# Patient Record
Sex: Male | Born: 1975 | Race: White | Hispanic: No | Marital: Married | Smoking: Never smoker
Health system: Southern US, Community
[De-identification: ages and names within clinical notes are randomized; demographics above are authoritative.]

## PROBLEM LIST (undated history)

## (undated) DIAGNOSIS — I10 Essential (primary) hypertension: Secondary | ICD-10-CM

## (undated) DIAGNOSIS — I2510 Atherosclerotic heart disease of native coronary artery without angina pectoris: Secondary | ICD-10-CM

## (undated) DIAGNOSIS — E785 Hyperlipidemia, unspecified: Secondary | ICD-10-CM

## (undated) HISTORY — DX: Atherosclerotic heart disease of native coronary artery without angina pectoris: I25.10

## (undated) HISTORY — DX: Essential (primary) hypertension: I10

## (undated) HISTORY — DX: Hyperlipidemia, unspecified: E78.5

---

## 2004-03-10 ENCOUNTER — Emergency Department (HOSPITAL_COMMUNITY): Admission: EM | Admit: 2004-03-10 | Discharge: 2004-03-10 | Payer: Self-pay | Admitting: Family Medicine

## 2005-04-14 ENCOUNTER — Emergency Department (HOSPITAL_COMMUNITY): Admission: EM | Admit: 2005-04-14 | Discharge: 2005-04-14 | Payer: Self-pay | Admitting: Family Medicine

## 2005-09-17 ENCOUNTER — Emergency Department (HOSPITAL_COMMUNITY): Admission: EM | Admit: 2005-09-17 | Discharge: 2005-09-17 | Payer: Self-pay | Admitting: Family Medicine

## 2007-05-09 ENCOUNTER — Ambulatory Visit: Payer: Self-pay | Admitting: Sports Medicine

## 2007-05-09 DIAGNOSIS — M79609 Pain in unspecified limb: Secondary | ICD-10-CM | POA: Insufficient documentation

## 2007-06-15 ENCOUNTER — Encounter (INDEPENDENT_AMBULATORY_CARE_PROVIDER_SITE_OTHER): Payer: Self-pay | Admitting: *Deleted

## 2007-06-15 ENCOUNTER — Telehealth (INDEPENDENT_AMBULATORY_CARE_PROVIDER_SITE_OTHER): Payer: Self-pay | Admitting: *Deleted

## 2007-06-19 ENCOUNTER — Encounter: Admission: RE | Admit: 2007-06-19 | Discharge: 2007-06-19 | Payer: Self-pay | Admitting: Neurosurgery

## 2007-06-22 ENCOUNTER — Ambulatory Visit: Payer: Self-pay | Admitting: Sports Medicine

## 2007-06-22 DIAGNOSIS — M84376A Stress fracture, unspecified foot, initial encounter for fracture: Secondary | ICD-10-CM | POA: Insufficient documentation

## 2007-08-15 ENCOUNTER — Ambulatory Visit: Payer: Self-pay | Admitting: Sports Medicine

## 2010-03-18 NOTE — Progress Notes (Signed)
Summary: CPT code  Phone Note Other Incoming Call back at [phone]   Call placed by: Merlyn Albert @ [facility] Summary of Call: Needs CPT code for xray. Initial call taken by: Haydee Salter,  June 15, 2007 1:54 PM  Follow-up for Phone Call        faxed Follow-up by: Lillia Pauls CMA,  June 15, 2007 2:33 PM         Appended Document: CPT code caller, Neil Crouch. @ [facility] called with MRI pre-authorization number- [id number] valid for 30 days.

## 2010-03-18 NOTE — Miscellaneous (Signed)
Summary: MRI APPT FOR R FOOT  Clinical Lists Changes  Orders: Added new Test order of MRI (MRI) - Signed  Appended Document: MRI APPT FOR R FOOT mri appt is on mon may 4th at 9:15am at [facility] imaging. their address is [address] phone number is [phone] if you need to call for any reason. they would like you there at about 8:45am if possible. pt emailed appt

## 2010-03-18 NOTE — Assessment & Plan Note (Signed)
Summary: to see Dr. Darrick Penna @ 4:00/bmc   Vital Signs:  Patient Profile:   35 Years Old Male Weight:      168.8 pounds Temp:     98.3 degrees F Pulse rate:   64 / minute BP sitting:   131 / 86  (left arm)  Pt. in pain?   no  Vitals Entered By: Alphia Kava (Jun 22, 2007 4:17 PM)              Is Patient Diabetic? No     Chief Complaint:  ov.  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 No apparent alopecia or balding. Msk:     Foot ande ankle exam are unremarkable I cannot bring out pain on percussion or palpation ankle ligaments are negative for injury no effusion N -spot and palpable tarsal bones and MT are non painful Extremities:     Hop test painful immediately cannot run Additional Exam:     MRI shows talar stress fracture mid central portion of lower talus moderate bone edema MRI grading system suggests at least a Grade 2 injury    Impression & Recommendations:  Problem # 1:  FOOT PAIN, RIGHT (ICD-729.5) etiology of pain seems clear suspect this will take 3 to 6 mos to become noin painful Orders: [facility]- Est Level  3 (04540)   Problem # 2:  STRESS FRACTURE, FOOT (ICD-733.94)  Orders: Cam Walkers - [facility] ([id number]) [facility]- Est Level  3 (14782)  will need to use cam walker minimum of 6 weeks and maybe much longer try walking this week  if still painful go to crutches  re ck in 4 weeks     ]

## 2010-03-18 NOTE — Assessment & Plan Note (Signed)
Summary: 1:30 appt/rght foot ? strss fx/nm   Vital Signs:  Patient Profile:   35 Years Old Male Weight:      165 pounds Pulse rate:   68 / minute BP sitting:   157 / 94  Vitals Entered By: Lillia Pauls CMA (May 09, 2007 1:43 PM)                 Chief Complaint:  NP /1:30 appt/rght foot ? strss fx/nm.  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 Initially the pain was on his lateral ankle, but this has moved anterolaterally. There is no h/o inversion or eversion. No swelling. No ecchymosis. No prior MD visits. No x-rays. Pt. has pain with walking and running. After initial pain, had 1 day off, then ran next day with signiicant pain. Has not attempted to run secondary to pain for 4 days.  running 40 miles a week prior to injury  No h/o fracture or stress fracture. No h/o of had some similar pain, less severe over the summer, which resolved. this acute pain is worse than before.        Review of Systems       For overall ROS, please see HPI. patient denies fevers, chills, myalgias, chest pain, shortness of breath, and otherwise has no complaints other than those noted.    Physical Exam  General:     Well-developed,well-nourished,in no acute distress; alert,appropriate and cooperative throughout examination Head:     Normocephalic and atraumatic without obvious abnormalities. No apparent alopecia or balding. Msk:     Morton's foot callus signifificant 1st toe consierable forefoot breakdown with toe splaying longitudinal arch collapse B R minimal hindfoot breakdown    Foot/Ankle Exam  Sensory:    gross sensation intact bilaterally in lower extremities.    Motor:    Motor strength 5/5 bilaterally for ankle dorsiflexion, ankle plantar flexion, ankle inversion and ankle eversion.    Ankle Exam:    Right:    Inspection:  Normal    Palpation:  Normal    Stability:   stable    Tenderness:  no    Swelling:  no    Erythema:  no    Left:    Inspection:  Normal    Palpation:  Normal    Stability:  stable    Tenderness:  no    Swelling:  no    Erythema:  no  Anterior Drawer:    Right negative; Left negative Syndesmosis Squeeze Test:    Right negative; Left negative Ankle External Rotation Test:    Right negative; Left negative First MTP Stability Test:    Right negative; Left negative Drawer Text of Lesser Toe MTP:    Right negative; Left negative  Hop test positive on the right on the jumping portion, less pain with landing portion of jump    Impression & Recommendations:  Problem # 1:  FOOT PAIN, RIGHT (ICD-729.5) Assessment: New R midfoot sprain, most likely. would keep midfoot stress fx or talus stress fx in differential.  Absolute rest 1 week, then may add elliptical or bike if no pain. if pain, stop.  f/u 2 weeks. if no better, would obtain MRI R foot. obtain XR first.  large metatarsal arch pad placed on hapad sport insole  Orders: Sports Insoles- [facility] [phone]) Metatarsal pads- [facility] [phone]) Richardson Dopp- [facility] [phone])      ]

## 2010-03-18 NOTE — Assessment & Plan Note (Signed)
Summary: FU PER Jhoanna Heyde/NM   Vital Signs:  Patient Profile:   35 Years Old Male Weight:      171 pounds Pulse rate:   68 / minute BP sitting:   131 / 82  Vitals Entered By: Lillia Pauls CMA (August 15, 2007 9:37 AM)                 Chief Complaint:  FU R FOOT STRESS FX.  History of Present Illness: Jovon returns for f/u of Rt talar stress fracture wore boot for  ~ 7 weeks no pain now out of boot has been doing hop test himself and says no pain with this at all has stayed off running  all xtraining on bike         Physical Exam  General:     Well-developed,well-nourished,in no acute distress; alert,appropriate and cooperative throughout examination Head:     Normocephalic and atraumatic without obvious abnormalities. No apparent alopecia or balding. Msk:     no pain on palpation of foot on RT full rt ankle motion with no pain ood strength on all resisted motions no swelling  gait shows slight break down of long arch on Rt with running that causes slight pronation  excellent running form and no limp    Impression & Recommendations:  Problem # 1:  STRESS FRACTURE, FOOT (ICD-733.94) Rt talar stress fx appears to be healed  will gradually reintroduce running start with only 20 mins increase every 3 to 5 days by 5 mins at 30 mins daily hold for 1 week before increase back down or skip days for any pain > 3/10 use sports insoles for more cushion if not enough will move to orthotics Orders: [facility]- Est Level  3 (99213) Sports Insoles- [facility] [phone])     ]

## 2012-11-22 ENCOUNTER — Encounter: Payer: Self-pay | Admitting: Sports Medicine

## 2012-11-22 ENCOUNTER — Ambulatory Visit (INDEPENDENT_AMBULATORY_CARE_PROVIDER_SITE_OTHER): Payer: BC Managed Care – PPO | Admitting: Sports Medicine

## 2012-11-22 VITALS — BP 124/80 | Ht 70.0 in | Wt 160.0 lb

## 2012-11-22 DIAGNOSIS — S8990XA Unspecified injury of unspecified lower leg, initial encounter: Secondary | ICD-10-CM

## 2012-11-22 DIAGNOSIS — S86302A Unspecified injury of muscle(s) and tendon(s) of peroneal muscle group at lower leg level, left leg, initial encounter: Secondary | ICD-10-CM

## 2012-11-22 DIAGNOSIS — M25472 Effusion, left ankle: Secondary | ICD-10-CM

## 2012-11-22 DIAGNOSIS — M25473 Effusion, unspecified ankle: Secondary | ICD-10-CM

## 2012-11-22 DIAGNOSIS — S86309A Unspecified injury of muscle(s) and tendon(s) of peroneal muscle group at lower leg level, unspecified leg, initial encounter: Secondary | ICD-10-CM | POA: Insufficient documentation

## 2012-11-22 MED ORDER — NITROGLYCERIN 0.2 MG/HR TD PT24: MEDICATED_PATCH | TRANSDERMAL | Status: DC

## 2012-11-22 NOTE — Patient Instructions (Signed)

## 2012-11-22 NOTE — Progress Notes (Signed)
Jason Lam is a 37 y.o. who presents today for L ankle pain.  Pt states he was out trail running around 4 months ago when he stepped on a branch wrong, and felt pain in the lateral aspect of his ankle.  He denies immediate swelling, denies inversion, denies increase in distant or change in terrain/shoes at that time.  Pt continued running through the pain, which eventually dissipated by the end of the run.   However, pt noticed that his pain returned later that night while at rest.  Over the following two months, pt continued to have intermittent sharp pain in the lateral ankle occasionally with running.  He denies any swelling at that time as well, any distal numbness/tingling/burning, previous injury to the ankle, pain at rest or with walking.  He has tried ice and advil at times which seem to help, but otherwise has not noticed any other modalities that have worked.  About one month ago, did notice he started to have an increase in his pain, w/o an acute injury at that time, which ended up limiting the distance he could run.  As well, pt stopped running about 1-2 weeks ago due to pain while running, especially at the beginning of his run that would not alleviate later into his run.    PMHx - Non contributory  Past Surgical Hx - Non contributory  Allergies - None  Meds - None  Social Hx - Non smoker, non drinker, non illicit substance use.  Teacher at [employer]  Family Hx - Father - HTN   Review of Symptoms 11 point ROS reviewed, negative, except for joint pain.   Physical Exam Filed Vitals:   11/22/12 1157  BP: 124/80    Gen: NAD, Well nourished, Well developed HEENT: EOMI, [place]/AT Neck: no JVD Cardio: RRR, No murmurs Lungs: CTA Neurovascular: MS 5/5 B/L UE and LE, +2 patellar and achilles relfex b/l,  DP/PT +2 B/L LE  Psych: AAO x 3  Ankle: No visible erythema or swelling. Range of motion is full in all directions. Strength is 5/5 in all directions.  No pain with  ankle eversion. Stable lateral and medial ligaments; squeeze test and kleiger test unremarkable; Talar dome nontender; no TTP at the base of the 5th MT No tenderness over N spot or navicular prominence No tenderness on posterior aspects of lateral and medial malleolus No sign of peroneal tendon subluxations; Negative tarsal tunnel tinel's Able to walk 4 steps.  Limited area of discreet tenderness below tip of malleolus   MSK [place] L ankle and foot - lateral malleoli hypoechoic region around the ankle joint capsule with small effusion present and possible soft tissue disruption.  Distal to this area, the peroneal tendons have hypoechoic fluid surrounding the sheath.  No acute or distinct bony injuries to the lateral malleoli or 5th MT base. Achilles intact without evidence of inflammation or injury

## 2012-11-22 NOTE — Assessment & Plan Note (Addendum)
Secondary to ankle effusion on the L side from probable soft tissue area, plan per ankle effusion, f/u in 6 weeks.    May or may not be true tendinopathy - minimal change x swelling

## 2012-11-22 NOTE — Assessment & Plan Note (Signed)
Pt with small lateral ankle effusion present on US showing possible small disruption of the soft tissue in the area.  This is most likely causing him to have irritation to the peroneal tendons with running and evident on US showing hypoechoic area surround the peroneal sheath.  Will have pt do the nitro protocol for 6 weeks, body helix sleeve to the area, and f/u in 6 weeks.

## 2018-09-14 ENCOUNTER — Ambulatory Visit: Payer: Self-pay

## 2018-09-14 ENCOUNTER — Encounter: Payer: Self-pay | Admitting: Sports Medicine

## 2018-09-14 ENCOUNTER — Other Ambulatory Visit: Payer: Self-pay

## 2018-09-14 ENCOUNTER — Ambulatory Visit: Payer: BC Managed Care – PPO | Admitting: Sports Medicine

## 2018-09-14 VITALS — BP 128/74 | Ht 70.0 in | Wt 165.0 lb

## 2018-09-14 DIAGNOSIS — S76311A Strain of muscle, fascia and tendon of the posterior muscle group at thigh level, right thigh, initial encounter: Secondary | ICD-10-CM

## 2018-09-14 DIAGNOSIS — S76301A Unspecified injury of muscle, fascia and tendon of the posterior muscle group at thigh level, right thigh, initial encounter: Secondary | ICD-10-CM | POA: Diagnosis not present

## 2018-09-14 NOTE — Progress Notes (Signed)
   PCP: No primary care provider on file.  Subjective:   HPI: Patient is a 43 y.o. male here for intermittent pain in right hamstring. He notes he has been having this pain off and on since March. 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 He notes the pain first occurred at the end of a run. Since then the pain flares more mid-run. It feels like a tightening feeling. Denies any pop at time of injury. He notes he will take a break and it will feel better. He will start running agauin and eventually flare up again. It has flared up 3 times since march which has led him here because he is concerned maybe there is more going on. His last flare was on 7/22 which improved with rest. He has since started running and the pain has not recurred yet. He has also treated the pain with a roller and body weight squats. He has not tried compression or ice. No history of trauma. When at rest he has no pain. He denies any current pain or tightness. Denies any numbness or tingling.   Review of Systems:  Per HPI.   [employer]ker, medications and smoking status reviewed.      Objective:  Physical Exam:  Gen: awake, alert, NAD, comfortable in exam room Pulm: breathing unlabored  Hamstring, bilateral: - Inspection: no gross deformity. No swelling, erythema, or bruising. Skin intact - Palpation: no TTP  - ROM: full active ROM of hip and knee - Strength: 5/5 strength  - (pt prone: foot midline have patient resist extension, repeat with foot IR and ER) - Sensation: NV intact - Special Tests:  - H test normal without pain bilaterally  - Diver test neg bilaterally  - normal stride run normal without pain or limp  - overstride run normal without pain or limp  - Hop-jump test negative bilaterally  Knee, bilateral: - Inspection: no gross deformity. No swelling/effusion, erythema or bruising. Skin intact - Palpation: no TTP - ROM: full active ROM with flexion  and extension - Strength: 5/5 strength - Neuro/vasc: NV intact  Hips: normal ROM, negative FABER and FADIR bilaterally    Assessment & Plan:  1. Hamstring Strain: History most consistent with repeated hamstring strain. Currently improved and asymptomatic. Physical exam today completely benign. 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 - home hamstring exercises/stretches to be completed daily  - Thigh compression sleeve to be used with running - Heal lifts to decrease stretch of hamstrings provided and placed in shoes  - RTC if worsening or no improvement. If presents with active pain, can consider ultrasound for further analysis.   Mina Marble, DO [facility], PGY2 09/14/2018 4:25 PM  I observed and examined the patient with the resident and agree with assessment and plan.  Note reviewed and modified by me. Ila Mcgill, MD

## 2018-09-14 NOTE — Assessment & Plan Note (Signed)
History most consistent with repeated hamstring strain. Currently improved and asymptomatic. Physical exam today completely benign. 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 - home hamstring exercises/stretches to be completed daily  - Thigh compression sleeve to be used with running - Heal raisers to decrease stretch of hamstrings provided and placed in shoes  - RTC if worsening or no improvement. If presents with active pain, can consider ultrasound for further analysis.

## 2020-01-01 ENCOUNTER — Other Ambulatory Visit: Payer: Self-pay | Admitting: *Deleted

## 2020-01-01 DIAGNOSIS — I252 Old myocardial infarction: Secondary | ICD-10-CM

## 2020-01-24 NOTE — Progress Notes (Signed)
Cardiology Office Note   Date:  01/25/2020   ID:  Jason Lam, DOB 22-Feb-1975, MRN [id number]  PCP:  No primary care provider on file.  Cardiologist:   No primary care provider on file. Referring:  Enid Baas, MD  Chief Complaint  Patient presents with  . Coronary Artery Disease      History of Present Illness: Jason Lam is a 44 y.o. male who is referred by Enid Baas, MD for evaluation of a history of a heart attack.     He did bring some records with him today.  He was in [place].  He had chest pain 45 minutes after running a marathon and had a lateral myocardial infarction.  This was in September 2021.  Left heart cath demonstrated coronary disease involving the mid right coronary with thrombus and he had stenting.  His ejection fraction was 55 to 60%.  There were no significant valvular abnormalities.  He said it had been about 10 years since he run a marathon but he was in good shape and he was trained normally for it.  Never had any past cardiac history.  After words while he was up in his hotel room he started to have diaphoresis and discomfort.  (I do not have the entire hospitalization report.)  Since that time he has felt well.  He never had any chest discomfort, neck or arm discomfort.  He has no shortness of breath, PND or orthopnea.  He has no palpitations, presyncope or syncope.   Past Medical History:  Diagnosis Date  . CAD (coronary artery disease)    RCA Stent 2021 November  . Dyslipidemia   . HTN (hypertension)     History reviewed. No pertinent surgical history.   Current Outpatient Medications  Medication Sig Dispense Refill  . ASPIRIN LOW DOSE 81 MG chewable tablet Chew 81 mg by mouth daily.    Marland Kitchen atorvastatin (LIPITOR) 80 MG tablet Take 80 mg by mouth daily.    Marland Kitchen BRILINTA 90 MG TABS tablet Take 90 mg by mouth 2 (two) times daily.    Marland Kitchen lisinopril (ZESTRIL) 5 MG tablet Take 5 mg by mouth daily.     No current  facility-administered medications for this visit.    Allergies:   Patient has no known allergies.    Social History:  The patient  reports that he has never smoked. He has never used smokeless tobacco.   Family History:  The patient's family history includes Bladder Cancer in his father; Hyperlipidemia in his father; Hypertension in his father.  He had a maternal aunt with early heart disease.   ROS:  Please see the history of present illness.   Otherwise, review of systems are positive for none.   All other systems are reviewed and negative.    PHYSICAL EXAM: VS:  BP 132/74 (BP Location: Right Arm, Patient Position: Sitting, Cuff Size: Normal)   Pulse (!) 59   Ht 5\' 10"  (1.778 m)   Wt 172 lb (78 kg)   BMI 24.68 kg/m  , BMI Body mass index is 24.68 kg/m. GENERAL:  Well appearing HEENT:  Pupils equal round and reactive, fundi not visualized, oral mucosa unremarkable NECK:  No jugular venous distention, waveform within normal limits, carotid upstroke brisk and symmetric, no bruits, no thyromegaly LYMPHATICS:  No cervical, inguinal adenopathy LUNGS:  Clear to auscultation bilaterally BACK:  No CVA tenderness CHEST:  Unremarkable HEART:  PMI not displaced or sustained,S1 and S2  within normal limits, no S3, no S4, no clicks, no rubs, no murmurs ABD:  Flat, positive bowel sounds normal in frequency in pitch, no bruits, no rebound, no guarding, no midline pulsatile mass, no hepatomegaly, no splenomegaly EXT:  2 plus pulses throughout, no edema, no cyanosis no clubbing, varicose veins left leg SKIN:  No rashes no nodules NEURO:  Cranial nerves II through XII grossly intact, motor grossly intact throughout PSYCH:  Cognitively intact, oriented to person place and time    EKG:  EKG is ordered today. The ekg ordered today demonstrates sinus rhythm, rate 59, right bundle branch block, T wave inversions in the lateral leads without old EKGs for comparison.   Recent Labs: No results found  for requested labs within last 8760 hours.    Lipid Panel No results found for: CHOL, TRIG, HDL, CHOLHDL, VLDL, LDLCALC, LDLDIRECT    Wt Readings from Last 3 Encounters:  01/25/20 172 lb (78 kg)  09/14/18 165 lb (74.8 kg)  11/22/12 160 lb (72.6 kg)      Other studies Reviewed: Additional studies/ records that were reviewed today include: Hospital records from [facility]. Review of the above records demonstrates:  Please see elsewhere in the note.     ASSESSMENT AND PLAN:  CAD: The patient may have had a ruptured plaque or primary thrombotic coronary event.  We had a long discussion about this.  He will remain on the meds as listed.  The Brilinta will be for 1 year.  I have asked him to start very slowly getting back to his exercise regimen.  We talked about increasing 10 %/week starting with walking.  HTN:  His BP is mildly above where I would like it and he is going to get blood pressure diary.  DYSLIPIDEMIA: Her stay on the current statin and I will check a lipid profile in 1 month.   Current medicines are reviewed at length with the patient today.  The patient does not have concerns regarding medicines.  The following changes have been made:  no change  Labs/ tests ordered today include:   Orders Placed This Encounter  Procedures  . Lipid panel  . Hepatic function panel  . EKG 12-Lead     Disposition:   FU with me in 3 months   Signed, Rollene Rotunda, MD  01/25/2020 9:11 AM    [facility]

## 2020-01-25 ENCOUNTER — Ambulatory Visit (INDEPENDENT_AMBULATORY_CARE_PROVIDER_SITE_OTHER): Payer: BC Managed Care – PPO | Admitting: Cardiology

## 2020-01-25 ENCOUNTER — Encounter: Payer: Self-pay | Admitting: Cardiology

## 2020-01-25 ENCOUNTER — Other Ambulatory Visit: Payer: Self-pay

## 2020-01-25 VITALS — BP 132/74 | HR 59 | Ht 70.0 in | Wt 172.0 lb

## 2020-01-25 DIAGNOSIS — I252 Old myocardial infarction: Secondary | ICD-10-CM

## 2020-01-25 DIAGNOSIS — E785 Hyperlipidemia, unspecified: Secondary | ICD-10-CM | POA: Diagnosis not present

## 2020-01-25 NOTE — Patient Instructions (Signed)
Medication Instructions:  NO CHANGES *If you need a refill on your cardiac medications before your next appointment, please call your pharmacy*  Lab Work: Your physician recommends that you return for lab work NEXT MONTH AROUND 03/01/2020 (LIPIDS/LIVER) If you have labs (blood work) drawn today and your tests are completely normal, you will receive your results only by: Marland Kitchen MyChart Message (if you have MyChart) OR . A paper copy in the mail If you have any lab test that is abnormal or we need to change your treatment, we will call you to review the results.  Testing/Procedures: NONE ORDERED THIS VISIT  Follow-Up: At [facility], you and your health needs are our priority.  As part of our continuing mission to provide you with exceptional heart care, we have created designated Provider Care Teams.  These Care Teams include your primary Cardiologist (physician) and Advanced Practice Providers (APPs -  Physician Assistants and Nurse Practitioners) who all work together to provide you with the care you need, when you need it.  We recommend signing up for the patient portal called "MyChart". Sign up information is provided on this After Visit Summary. MyChart is used to connect with patients for Virtual Visits (Telemedicine). Patients are able to view lab/test results, encounter notes, upcoming appointments, etc. Non-urgent messages can be sent to your provider as well. To learn more about what you can do with MyChart, go to [email or web address].    Your next appointment:   3 month(s)  The format for your next appointment:   In Person  Provider:   Rollene Rotunda, MD

## 2020-01-28 ENCOUNTER — Ambulatory Visit: Payer: BC Managed Care – PPO | Admitting: Family Medicine

## 2020-03-01 LAB — LIPID PANEL
Chol/HDL Ratio: 2.1 ratio (ref 0.0–5.0)
Cholesterol, Total: 147 mg/dL (ref 100–199)
HDL: 69 mg/dL (ref >39)
LDL Chol Calc (NIH): 68 mg/dL (ref 0–99)
Triglycerides: 45 mg/dL (ref 0–149)
VLDL Cholesterol Cal: 10 mg/dL (ref 5–40)

## 2020-03-01 LAB — HEPATIC FUNCTION PANEL
ALT: 52 IU/L — ABNORMAL HIGH (ref 0–44)
AST: 36 IU/L (ref 0–40)
Albumin: 4.6 g/dL (ref 4.0–5.0)
Alkaline Phosphatase: 78 IU/L (ref 44–121)
Bilirubin Total: 0.6 mg/dL (ref 0.0–1.2)
Bilirubin, Direct: 0.2 mg/dL (ref 0.00–0.40)
Total Protein: 6.4 g/dL (ref 6.0–8.5)

## 2020-04-23 DIAGNOSIS — I2510 Atherosclerotic heart disease of native coronary artery without angina pectoris: Secondary | ICD-10-CM | POA: Insufficient documentation

## 2020-04-23 DIAGNOSIS — E785 Hyperlipidemia, unspecified: Secondary | ICD-10-CM | POA: Insufficient documentation

## 2020-04-23 DIAGNOSIS — I10 Essential (primary) hypertension: Secondary | ICD-10-CM | POA: Insufficient documentation

## 2020-04-23 NOTE — Progress Notes (Signed)
Cardiology Office Note   Date:  04/24/2020   ID:  Jason Lam, DOB [birth year], MRN [id number]  PCP:  Pcp, No  Cardiologist:   No primary care provider on file. Referring:  No ref. provider found  Chief Complaint  Patient presents with  . Coronary Artery Disease      History of Present Illness: Jason Lam is a 45 y.o. male who is referred by No ref. provider found for evaluation of a history of a heart attack.    He was in [employer].  He had chest pain 45 minutes after running a marathon and had a lateral myocardial infarction.  This was in Nov 2021.  Left heart cath demonstrated coronary disease involving the mid right coronary with thrombus and he had stenting.  His ejection fraction was 55 to 60%.  There were no significant valvular abnormalities.    Since I last saw him he has done well.  The patient denies any new symptoms such as chest discomfort, neck or arm discomfort. There has been no new shortness of breath, PND or orthopnea. There have been no reported palpitations, presyncope or syncope.  He is running about 3 miles per day.    Past Medical History:  Diagnosis Date  . CAD (coronary artery disease)    RCA Stent 2021 November  . Dyslipidemia   . HTN (hypertension)     History reviewed. No pertinent surgical history.   Current Outpatient Medications  Medication Sig Dispense Refill  . ASPIRIN LOW DOSE 81 MG chewable tablet Chew 81 mg by mouth daily.    Marland Kitchen atorvastatin (LIPITOR) 80 MG tablet Take 80 mg by mouth daily.    Marland Kitchen BRILINTA 90 MG TABS tablet Take 90 mg by mouth 2 (two) times daily.    Marland Kitchen lisinopril (ZESTRIL) 5 MG tablet Take 5 mg by mouth daily.     No current facility-administered medications for this visit.    Allergies:   Patient has no known allergies.    ROS:  Please see the history of present illness.   Otherwise, review of systems are positive for none.   All other systems are reviewed and negative.    PHYSICAL  EXAM: VS:  BP 116/84 (BP Location: Left Arm, Patient Position: Sitting)   Pulse 62   Ht 5\' 10"  (1.778 m)   Wt 176 lb 3.2 oz (79.9 kg)   SpO2 99%   BMI 25.28 kg/m  , BMI Body mass index is 25.28 kg/m. GENERAL:  Well appearing NECK:  No jugular venous distention, waveform within normal limits, carotid upstroke brisk and symmetric, no bruits, no thyromegaly LUNGS:  Clear to auscultation bilaterally CHEST:  Unremarkable HEART:  PMI not displaced or sustained,S1 and S2 within normal limits, no S3, no S4, no clicks, no rubs, no murmurs ABD:  Flat, positive bowel sounds normal in frequency in pitch, no bruits, no rebound, no guarding, no midline pulsatile mass, no hepatomegaly, no splenomegaly EXT:  2 plus pulses throughout, no edema, no cyanosis no clubbing   EKG:  EKG is not ordered today.    Recent Labs: 02/29/2020: ALT 52    Lipid Panel    Component Value Date/Time   CHOL 147 02/29/2020 0808   TRIG 45 02/29/2020 0808   HDL 69 02/29/2020 0808   CHOLHDL 2.1 02/29/2020 0808   LDLCALC 68 02/29/2020 0808      Wt Readings from Last 3 Encounters:  04/24/20 176 lb 3.2 oz (79.9 kg)  01/25/20 172 lb (78 kg)  09/14/18 165 lb (74.8 kg)      Other studies Reviewed: Additional studies/ records that were reviewed today include: Labs Review of the above records demonstrates:  Please see elsewhere in the note.     ASSESSMENT AND PLAN:  CAD:   The patient has no new sypmtoms.  No further cardiovascular testing is indicated.  We will continue with aggressive risk reduction and meds as listed.  I will see him back in November and likely stop the lisinopril.  HTN:  His BP is at target.  I told him if he starts to get lower as he gets stronger and if he gets lightheaded we could back off on his lisinopril.   DYSLIPIDEMIA:   LDL 68 HDL 69.  No change in therapy.   Current medicines are reviewed at length with the patient today.  The patient does not have concerns regarding  medicines.  The following changes have been made:  None  Labs/ tests ordered today include: None  No orders of the defined types were placed in this encounter.    Disposition:   FU with me in Nov.   Signed, Rollene Rotunda, MD  04/24/2020 9:27 AM    [facility]

## 2020-04-24 ENCOUNTER — Ambulatory Visit (INDEPENDENT_AMBULATORY_CARE_PROVIDER_SITE_OTHER): Payer: BC Managed Care – PPO | Admitting: Cardiology

## 2020-04-24 ENCOUNTER — Encounter: Payer: Self-pay | Admitting: Cardiology

## 2020-04-24 ENCOUNTER — Other Ambulatory Visit: Payer: Self-pay

## 2020-04-24 VITALS — BP 116/84 | HR 62 | Ht 70.0 in | Wt 176.2 lb

## 2020-04-24 DIAGNOSIS — E785 Hyperlipidemia, unspecified: Secondary | ICD-10-CM | POA: Diagnosis not present

## 2020-04-24 DIAGNOSIS — I10 Essential (primary) hypertension: Secondary | ICD-10-CM

## 2020-04-24 DIAGNOSIS — I2510 Atherosclerotic heart disease of native coronary artery without angina pectoris: Secondary | ICD-10-CM | POA: Diagnosis not present

## 2020-04-24 NOTE — Patient Instructions (Signed)
Medication Instructions:  Continue current medications  *If you need a refill on your cardiac medications before your next appointment, please call your pharmacy*   Lab Work: None Ordered   Testing/Procedures: None Ordered   Follow-Up: At [facility], you and your health needs are our priority.  As part of our continuing mission to provide you with exceptional heart care, we have created designated Provider Care Teams.  These Care Teams include your primary Cardiologist (physician) and Advanced Practice Providers (APPs -  Physician Assistants and Nurse Practitioners) who all work together to provide you with the care you need, when you need it.  We recommend signing up for the patient portal called "MyChart".  Sign up information is provided on this After Visit Summary.  MyChart is used to connect with patients for Virtual Visits (Telemedicine).  Patients are able to view lab/test results, encounter notes, upcoming appointments, etc.  Non-urgent messages can be sent to your provider as well.   To learn more about what you can do with MyChart, go to [email or web address].    Your next appointment:   9 month(s)  The format for your next appointment:   In Person  Provider:   You may see Rollene Rotunda, MD or one of the following Advanced Practice Providers on your designated Care Team:    Theodore Demark, PA-C  Joni Reining, DNP, ANP

## 2020-05-15 ENCOUNTER — Other Ambulatory Visit: Payer: Self-pay | Admitting: Gastroenterology

## 2020-05-15 DIAGNOSIS — R195 Other fecal abnormalities: Secondary | ICD-10-CM

## 2020-05-26 MED ORDER — ATORVASTATIN CALCIUM 80 MG PO TABS: 80.0000 mg | ORAL_TABLET | Freq: Every day | ORAL | 3 refills | Status: DC

## 2020-05-26 MED ORDER — LISINOPRIL 5 MG PO TABS: 5.0000 mg | ORAL_TABLET | Freq: Every day | ORAL | 3 refills | Status: DC

## 2020-06-12 ENCOUNTER — Ambulatory Visit
Admission: RE | Admit: 2020-06-12 | Discharge: 2020-06-12 | Disposition: A | Discharge status: Home / Self Care | Payer: BC Managed Care – PPO | Source: Ambulatory Visit | Attending: Gastroenterology | Admitting: Gastroenterology

## 2020-06-12 ENCOUNTER — Other Ambulatory Visit: Payer: Self-pay

## 2020-06-12 DIAGNOSIS — R195 Other fecal abnormalities: Secondary | ICD-10-CM

## 2020-12-28 NOTE — Progress Notes (Signed)
Cardiology Office Note   Date:  12/29/2020   ID:  Jason Lam, DOB 10/15/1975, MRN [id number]  PCP:  Pcp, No  Cardiologist:   Rollene Rotunda, MD Referring:  No ref. provider found  Chief Complaint  Patient presents with   Coronary Artery Disease       History of Present Illness: Jason Lam is a 45 y.o. male who is followed for evaluation of a history of a heart attack.    He was in [employer].  He had chest pain 45 minutes after running a marathon and had a lateral myocardial infarction.  This was in Nov 2021.  Left heart cath demonstrated coronary disease involving the mid right coronary with thrombus and he had stenting.  His ejection fraction was 55 to 60%.  There were no significant valvular abnormalities.    Since I last saw him he has done well.  He is back to running.  The patient denies any new symptoms such as chest discomfort, neck or arm discomfort. There has been no new shortness of breath, PND or orthopnea. There have been no reported palpitations, presyncope or syncope.   Past Medical History:  Diagnosis Date   CAD (coronary artery disease)    RCA Stent 2021 November   Dyslipidemia    HTN (hypertension)     History reviewed. No pertinent surgical history.   Current Outpatient Medications  Medication Sig Dispense Refill   ASPIRIN LOW DOSE 81 MG chewable tablet Chew 81 mg by mouth daily.     atorvastatin (LIPITOR) 80 MG tablet Take 1 tablet (80 mg total) by mouth daily. 90 tablet 3   lisinopril (ZESTRIL) 5 MG tablet Take 1 tablet (5 mg total) by mouth daily. 90 tablet 3   No current facility-administered medications for this visit.    Allergies:   Patient has no known allergies.    ROS:  Please see the history of present illness.   Otherwise, review of systems are positive for none.   All other systems are reviewed and negative.    PHYSICAL EXAM: VS:  BP 110/76   Pulse 65   Ht 5\' 10"  (1.778 m)   Wt 171 lb 3.2 oz  (77.7 kg)   SpO2 98%   BMI 24.56 kg/m  , BMI Body mass index is 24.56 kg/m. GENERAL:  Well appearing NECK:  No jugular venous distention, waveform within normal limits, carotid upstroke brisk and symmetric, no bruits, no thyromegaly LUNGS:  Clear to auscultation bilaterally CHEST:  Unremarkable HEART:  PMI not displaced or sustained,S1 and S2 within normal limits, no S3, no S4, no clicks, no rubs, no murmurs ABD:  Flat, positive bowel sounds normal in frequency in pitch, no bruits, no rebound, no guarding, no midline pulsatile mass, no hepatomegaly, no splenomegaly EXT:  2 plus pulses throughout, no edema, no cyanosis no clubbing   EKG:  EKG is  ordered today. Sinus rhythm, rate 67, left axis deviation, borderline interventricular conduction delay with RSR prime V1 V2 incomplete right bundle branch block.  Nonspecific inferior T wave changes improved from previous.   Recent Labs: 02/29/2020: ALT 52    Lipid Panel    Component Value Date/Time   CHOL 147 02/29/2020 0808   TRIG 45 02/29/2020 0808   HDL 69 02/29/2020 0808   CHOLHDL 2.1 02/29/2020 0808   LDLCALC 68 02/29/2020 0808      Wt Readings from Last 3 Encounters:  12/29/20 171 lb 3.2 oz (77.7 kg)  04/24/20 176 lb 3.2 oz (79.9 kg)  01/25/20 172 lb (78 kg)      Other studies Reviewed: Additional studies/ records that were reviewed today include: None Review of the above records demonstrates:  NA  ASSESSMENT AND PLAN:  CAD:   The patient has no new sypmtoms.  No further cardiovascular testing is indicated.  He can stop his Brilinta  HTN:  His BP is at target and I would continue the lisinopril.   DYSLIPIDEMIA:   LDL was at target and he will get a lipid profile in February.    Current medicines are reviewed at length with the patient today.  The patient does not have concerns regarding medicines.  The following changes have been made: As above  Labs/ tests ordered today include: None  Orders Placed This  Encounter  Procedures   EKG 12-Lead      Disposition:   FU with me in 1 year  Signed, Rollene Rotunda, MD  12/29/2020 1:10 PM    [facility]

## 2020-12-29 ENCOUNTER — Encounter: Payer: Self-pay | Admitting: Cardiology

## 2020-12-29 ENCOUNTER — Ambulatory Visit: Payer: BC Managed Care – PPO | Admitting: Cardiology

## 2020-12-29 ENCOUNTER — Other Ambulatory Visit: Payer: Self-pay

## 2020-12-29 VITALS — BP 110/76 | HR 65 | Ht 70.0 in | Wt 171.2 lb

## 2020-12-29 DIAGNOSIS — I2510 Atherosclerotic heart disease of native coronary artery without angina pectoris: Secondary | ICD-10-CM

## 2020-12-29 DIAGNOSIS — E785 Hyperlipidemia, unspecified: Secondary | ICD-10-CM

## 2020-12-29 DIAGNOSIS — I10 Essential (primary) hypertension: Secondary | ICD-10-CM

## 2020-12-29 NOTE — Patient Instructions (Addendum)
Medication Instructions:  STOP BRILINTA  *If you need a refill on your cardiac medications before your next appointment, please call your pharmacy*  Lab Work:   Testing/Procedures:  NONE    NONE  Special Instructions NONE  Follow-Up: Your next appointment:  12 month(s) In Person with Rollene Rotunda, MD  or Edd Fabian, FNP, Micah Flesher, PA-C, Marjie Skiff, PA-C, Robet Leu, PA-C, Juanda Crumble, PA-C, Joni Reining, DNP, ANP, Azalee Course, PA-C, or Bernadene Person, NP      Please call our office 2 months in advance to schedule this appointment   At [facility], you and your health needs are our priority.  As part of our continuing mission to provide you with exceptional heart care, we have created designated Provider Care Teams.  These Care Teams include your primary Cardiologist (physician) and Advanced Practice Providers (APPs -  Physician Assistants and Nurse Practitioners) who all work together to provide you with the care you need, when you need it.

## 2021-05-09 ENCOUNTER — Other Ambulatory Visit: Payer: Self-pay | Admitting: Cardiology

## 2021-08-27 ENCOUNTER — Encounter: Payer: Self-pay | Admitting: Cardiology

## 2021-09-29 IMAGING — CT CT VIRTUAL COLONOSCOPY SCREENING
3 of 9 series · 15 of 46 positions shown, 17 images · non-contrast
Comparison: None

CLINICAL DATA: Positive FIT

EXAM:
CT VIRTUAL COLONOSCOPY SCREENING
TECHNIQUE: The patient was given a standard bowel preparation with Gastrografin
and barium for fluid and stool tagging respectively. The quality of
the bowel preparation is fair. Automated CO2 insufflation of the
colon was performed prior to image acquisition and colonic
distention is moderate. Image post processing was used to generate a
3D endoluminal fly-through projection of the colon and to
electronically subtract stool/fluid as appropriate.

[Series 4: supine colon 1.50 br40 s3 supine thins · axial · 0.72mm/px · z∈[+1298,+1417]mm · 3 of 357 slices shown]
[im 40/357  soft-tissue]
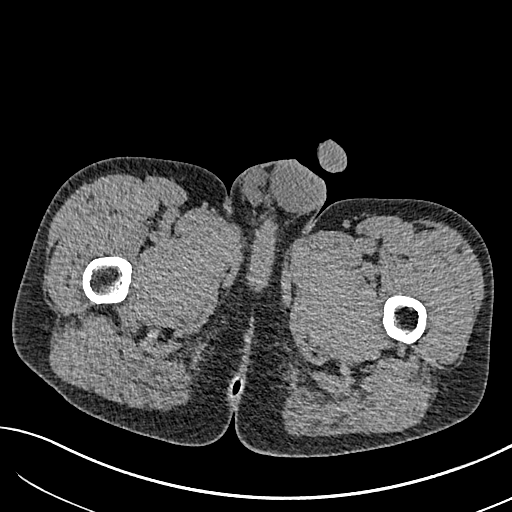
[im 80/357  soft-tissue]
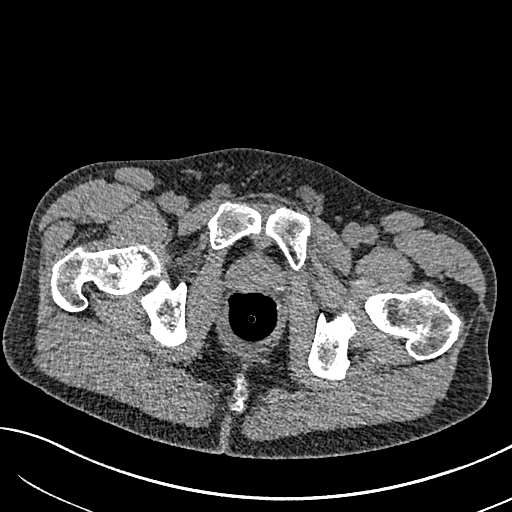
[im 119/357  soft-tissue]
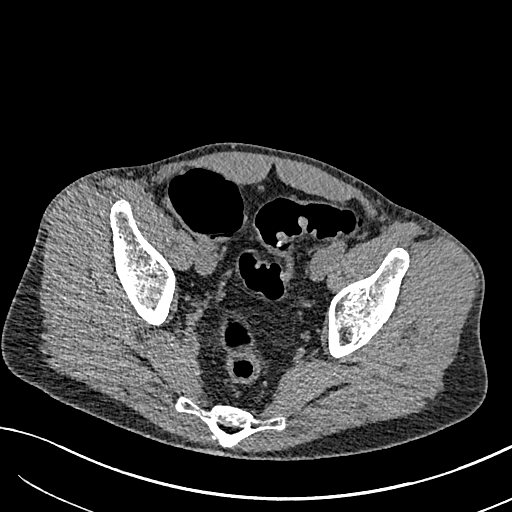

[Series 6: supine colon 3.00 br40 s3 cor supine · coronal · 0.71mm/px · 3 of 107 slices shown]
[im 27/107  soft-tissue]
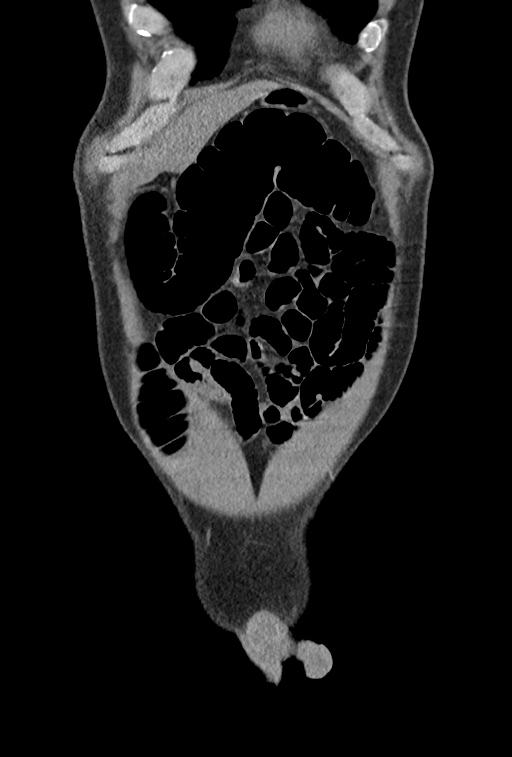
[im 54/107  soft-tissue]
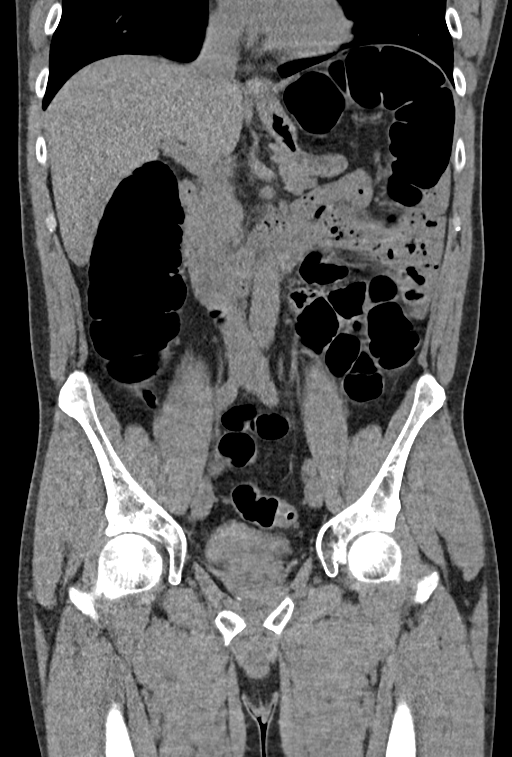
[im 80/107  soft-tissue]
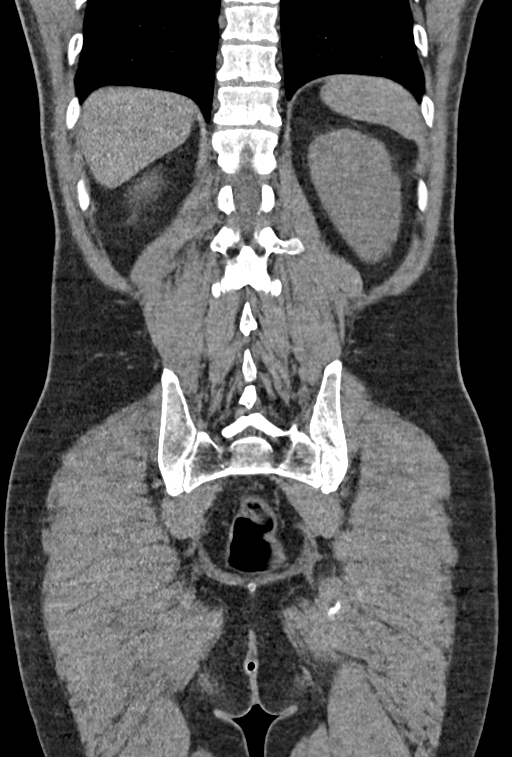

[Series 11: prone colon 1.50 br40 s3 prone thin · axial · 0.79mm/px · z∈[+1400,+1842]mm · 9 of 369 slices shown, 11 images]
[im 37/369  soft-tissue]
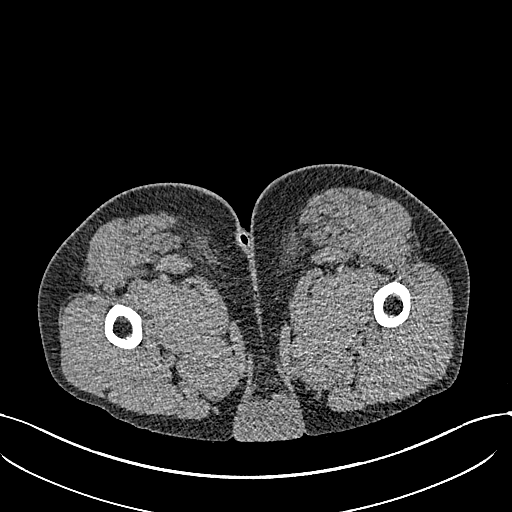
[im 37/369  bone]
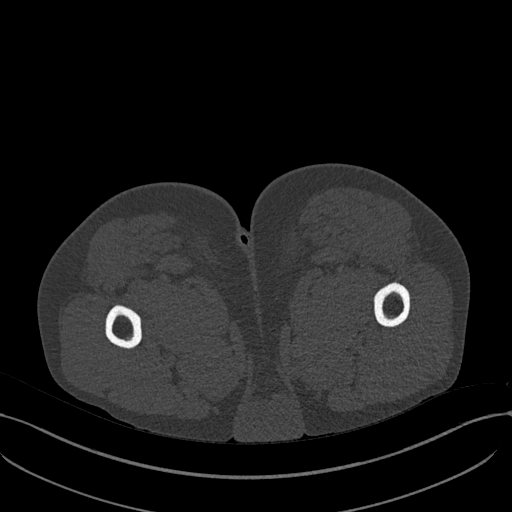
[im 74/369  soft-tissue]
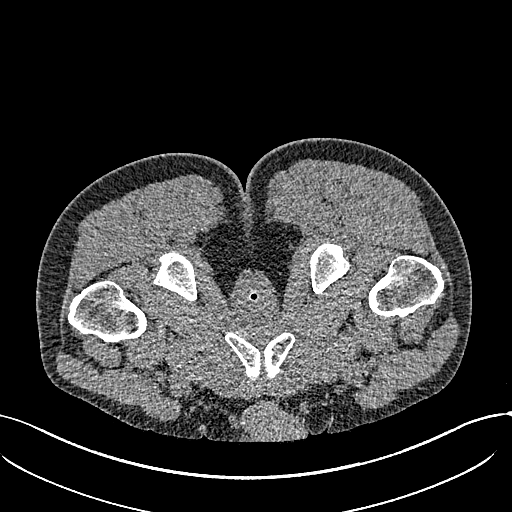
[im 111/369  soft-tissue]
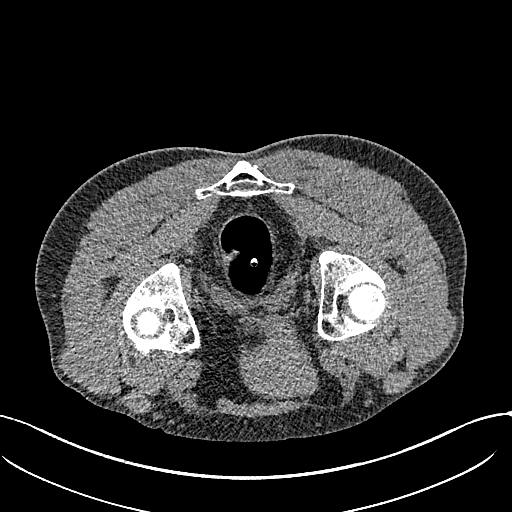
[im 148/369  soft-tissue]
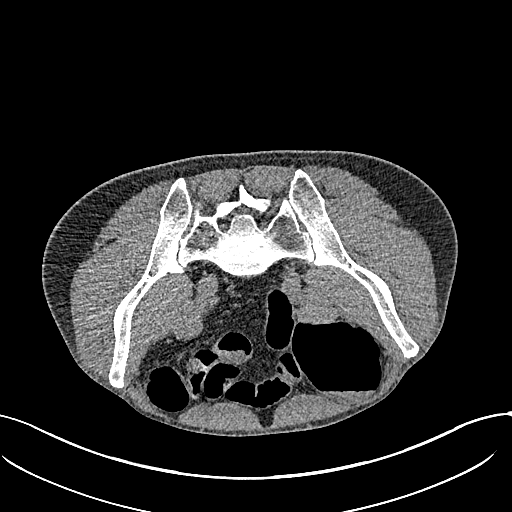
[im 185/369  soft-tissue]
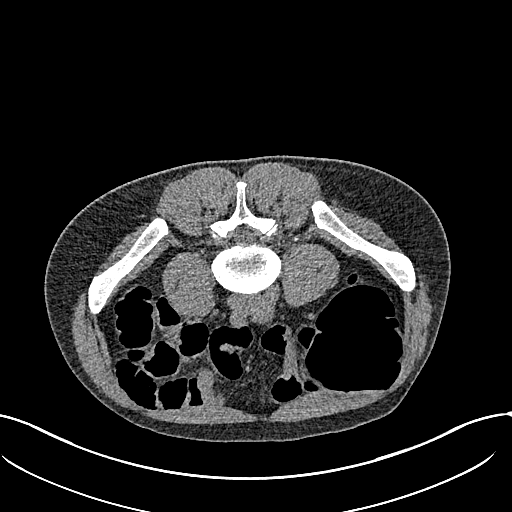
[im 221/369  soft-tissue]
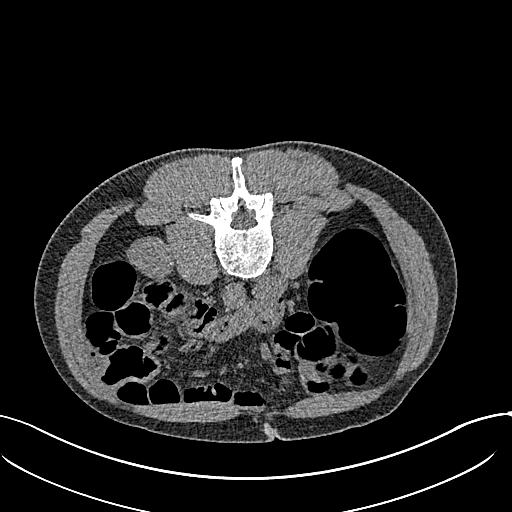
[im 258/369  soft-tissue]
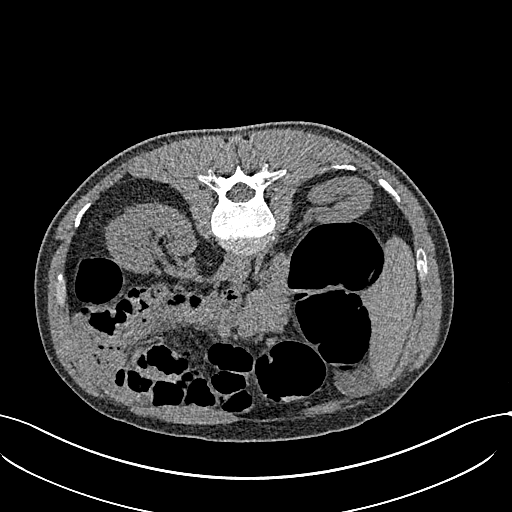
[im 295/369  soft-tissue]
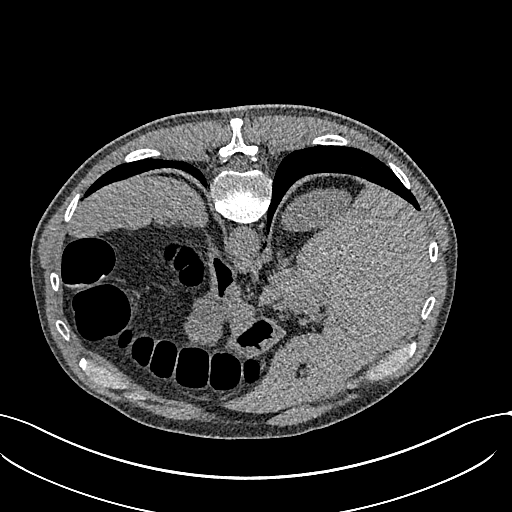
[im 332/369  soft-tissue]
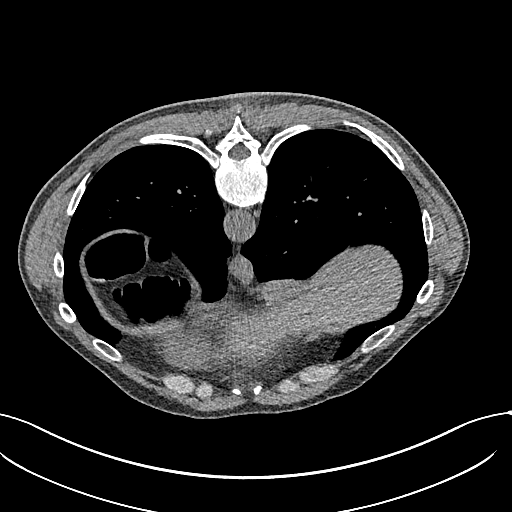
[im 332/369  bone]
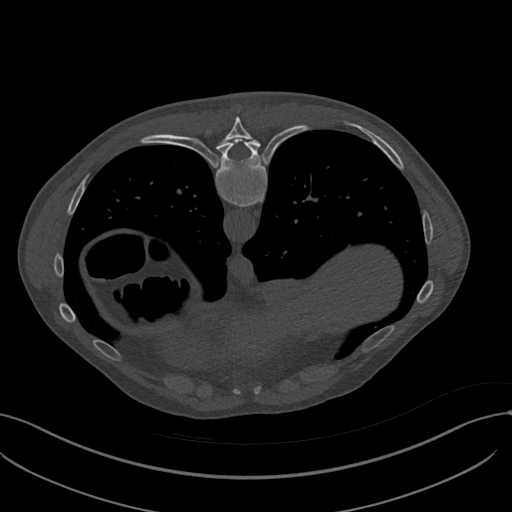

[15 of 46 positions shown; findings below may reference images not displayed]

FINDINGS: VIRTUAL COLONOSCOPY

Moderate retained layering stool within the colon. Under distension
within the sigmoid colon on supine imaging, slightly better on prone
imaging. No visible fixed polypoid filling defects or annular
constricting lesions.

Virtual colonoscopy is not designed to detect diminutive polyps
(i.e., less than or equal to 5 mm), the presence or absence of which
may not affect clinical management.

CT ABDOMEN AND PELVIS WITHOUT CONTRAST

Lower chest: No acute findings. Calcifications and/or stent seen in
the visualized right coronary artery.

Hepatobiliary: No focal hepatic abnormality. Gallbladder
unremarkable.

Pancreas: No focal abnormality or ductal dilatation.

Spleen: No focal abnormality.  Normal size.

Adrenals/Urinary Tract: 4 mm calcification in the midpole of the
right kidney. No hydronephrosis. Adrenal glands and urinary bladder
unremarkable.

Stomach/Bowel: Stomach and small bowel decompressed, grossly
unremarkable.

Vascular/Lymphatic: No evidence of aneurysm or adenopathy.

Reproductive: No visible focal abnormality.

Other: No free fluid or free air.

Musculoskeletal: No acute bony abnormality.
IMPRESSION: Moderate retained layering stool and mild under distention of the
sigmoid colon. No visible fixed polypoid filling defects or annular
constricting lesions.

Right nephrolithiasis.  No hydronephrosis.

No acute extra cardiac abnormality.

## 2022-01-15 ENCOUNTER — Ambulatory Visit: Payer: BC Managed Care – PPO | Admitting: Cardiology

## 2022-01-21 NOTE — Progress Notes (Signed)
Cardiology Office Note   Date:  01/22/2022   ID:  Jason Lam, DOB 22-Oct-1975, MRN [id number]  PCP:  Pcp, No  Cardiologist:   Rollene Rotunda, MD Referring:  No ref. provider found  Chief Complaint  Patient presents with   Coronary Artery Disease       History of Present Illness: Jason Lam is a 46 y.o. male who is followed for evaluation of a history of a heart attack.    He was in [place] [employer].  He had chest pain 45 minutes after running a marathon and had a lateral myocardial infarction.  This was in Nov 2021.  Left heart cath demonstrated coronary disease involving the mid right coronary with thrombus and he had stenting.  His ejection fraction was 55 to 60%.    Since I last saw him he is doing well.  He still exercising and running 3 to 4 days/week as well as doing weights as well.  He is still teaching.  The patient denies any new symptoms such as chest discomfort, neck or arm discomfort. There has been no new shortness of breath, PND or orthopnea. There have been no reported palpitations, presyncope or syncope.    Past Medical History:  Diagnosis Date   CAD (coronary artery disease)    RCA Stent 2021 November   Dyslipidemia    HTN (hypertension)     History reviewed. No pertinent surgical history.   Current Outpatient Medications  Medication Sig Dispense Refill   ASPIRIN LOW DOSE 81 MG chewable tablet Chew 81 mg by mouth daily.     lisinopril (ZESTRIL) 5 MG tablet TAKE 1 TABLET (5 MG TOTAL) BY MOUTH DAILY. 90 tablet 3   rosuvastatin (CRESTOR) 40 MG tablet Take 1 tablet (40 mg total) by mouth daily. 90 tablet 3   No current facility-administered medications for this visit.    Allergies:   Patient has no known allergies.    ROS:  Please see the history of present illness.   Otherwise, review of systems are positive for none.   All other systems are reviewed and negative.    PHYSICAL EXAM: VS:  BP 110/70 (BP Location: Left Arm, Patient  Position: Sitting, Cuff Size: Normal)   Pulse (!) 54   Ht 5\' 10"  (1.778 m)   Wt 179 lb (81.2 kg)   BMI 25.68 kg/m  , BMI Body mass index is 25.68 kg/m. GENERAL:  Well appearing NECK:  No jugular venous distention, waveform within normal limits, carotid upstroke brisk and symmetric, no bruits, no thyromegaly LUNGS:  Clear to auscultation bilaterally CHEST:  Unremarkable HEART:  PMI not displaced or sustained,S1 and S2 within normal limits, no S3, no S4, no clicks, no rubs, no murmurs ABD:  Flat, positive bowel sounds normal in frequency in pitch, no bruits, no rebound, no guarding, no midline pulsatile mass, no hepatomegaly, no splenomegaly EXT:  2 plus pulses throughout, no edema, no cyanosis no clubbing   EKG:  EKG is  ordered today. Sinus rhythm, rate 54, left axis deviation, borderline interventricular conduction delay with RSR prime V1 V2 incomplete right bundle branch block.  Nonspecific inferior T wave changes improved from previous.  No change from previous  Recent Labs: No results found for requested labs within last 365 days.    Lipid Panel    Component Value Date/Time   CHOL 147 02/29/2020 0808   TRIG 45 02/29/2020 0808   HDL 69 02/29/2020 0808   CHOLHDL 2.1 02/29/2020 03/02/2020  LDLCALC 68 02/29/2020 0808      Wt Readings from Last 3 Encounters:  01/22/22 179 lb (81.2 kg)  12/29/20 171 lb 3.2 oz (77.7 kg)  04/24/20 176 lb 3.2 oz (79.9 kg)      Other studies Reviewed: Additional studies/ records that were reviewed today include: Labs Review of the above records demonstrates: See elsewhere  ASSESSMENT AND PLAN:  CAD:  The patient has no new sypmtoms.  No further cardiovascular testing is indicated.  We will continue with aggressive risk reduction and meds as listed.  HTN:  His BP at target.  No change in therapy.  DYSLIPIDEMIA:   LDL is 80 HDL.  I would like his LDL to be in the 50s and we will switch him to Crestor.  He will come back in about 4 months for  fasting lipids with LP(a).     Current medicines are reviewed at length with the patient today.  The patient does not have concerns regarding medicines.  The following changes have been made: As above  Labs/ tests ordered today include:       Orders Placed This Encounter  Procedures   Lipid panel   Lipoprotein A (LPA)   EKG 12-Lead    Disposition:   FU with me in 1 year   Signed, Minus Breeding, MD  01/22/2022 8:43 AM    [place]

## 2022-01-22 ENCOUNTER — Encounter: Payer: Self-pay | Admitting: Cardiology

## 2022-01-22 ENCOUNTER — Ambulatory Visit: Payer: BC Managed Care – PPO | Attending: Cardiology | Admitting: Cardiology

## 2022-01-22 VITALS — BP 110/70 | HR 54 | Ht 70.0 in | Wt 179.0 lb

## 2022-01-22 DIAGNOSIS — E785 Hyperlipidemia, unspecified: Secondary | ICD-10-CM | POA: Diagnosis not present

## 2022-01-22 DIAGNOSIS — I2510 Atherosclerotic heart disease of native coronary artery without angina pectoris: Secondary | ICD-10-CM

## 2022-01-22 DIAGNOSIS — I10 Essential (primary) hypertension: Secondary | ICD-10-CM

## 2022-01-22 MED ORDER — ROSUVASTATIN CALCIUM 40 MG PO TABS: 40.0000 mg | ORAL_TABLET | Freq: Every day | ORAL | 3 refills | Status: DC

## 2022-01-22 NOTE — Patient Instructions (Signed)
Medication Instructions:   STOP Lipitor START Crestor 40 mg daily *If you need a refill on your cardiac medications before your next appointment, please call your pharmacy*  Lab Work: Your physician recommends that you return for lab work in 4 months:  Fasting Lipid Panel-DO NOT eat or drink past midnight. Okay to have water the morning of  LP(a)  If you have labs (blood work) drawn today and your tests are completely normal, you will receive your results only by: MyChart Message (if you have MyChart) OR A paper copy in the mail If you have any lab test that is abnormal or we need to change your treatment, we will call you to review the results.  Testing/Procedures: NONE ordered at this time of appointment   Follow-Up: At [facility], you and your health needs are our priority.  As part of our continuing mission to provide you with exceptional heart care, we have created designated Provider Care Teams.  These Care Teams include your primary Cardiologist (physician) and Advanced Practice Providers (APPs -  Physician Assistants and Nurse Practitioners) who all work together to provide you with the care you need, when you need it.  Your next appointment:   1 year(s)  The format for your next appointment:   In Person  Provider:   Rollene Rotunda, MD     Other Instructions  Important Information About Sugar

## 2022-03-03 ENCOUNTER — Other Ambulatory Visit: Payer: Self-pay | Admitting: Family Medicine

## 2022-03-03 DIAGNOSIS — R1013 Epigastric pain: Secondary | ICD-10-CM

## 2022-03-17 ENCOUNTER — Ambulatory Visit
Admission: RE | Admit: 2022-03-17 | Discharge: 2022-03-17 | Disposition: A | Discharge status: Home / Self Care | Payer: BC Managed Care – PPO | Source: Ambulatory Visit | Attending: Family Medicine | Admitting: Family Medicine

## 2022-03-17 DIAGNOSIS — R1013 Epigastric pain: Secondary | ICD-10-CM

## 2022-05-14 ENCOUNTER — Other Ambulatory Visit: Payer: Self-pay | Admitting: Cardiology

## 2022-05-20 LAB — LIPID PANEL
Chol/HDL Ratio: 2.3 ratio (ref 0.0–5.0)
Cholesterol, Total: 166 mg/dL (ref 100–199)
HDL: 73 mg/dL (ref >39)
LDL Chol Calc (NIH): 81 mg/dL (ref 0–99)
Triglycerides: 60 mg/dL (ref 0–149)
VLDL Cholesterol Cal: 12 mg/dL (ref 5–40)

## 2022-05-20 LAB — LIPOPROTEIN A (LPA): Lipoprotein (a): <8.4 nmol/L (ref <75.0)

## 2022-05-21 ENCOUNTER — Other Ambulatory Visit: Payer: Self-pay | Admitting: *Deleted

## 2022-05-21 DIAGNOSIS — E785 Hyperlipidemia, unspecified: Secondary | ICD-10-CM

## 2022-05-21 MED ORDER — EZETIMIBE 10 MG PO TABS: 10.0000 mg | ORAL_TABLET | Freq: Every day | ORAL | 3 refills | Status: DC

## 2022-05-27 LAB — LAB REPORT - SCANNED: EGFR: 96

## 2022-06-15 ENCOUNTER — Encounter: Payer: Self-pay | Admitting: *Deleted

## 2022-08-18 LAB — LIPID PANEL
Chol/HDL Ratio: 1.8 ratio (ref 0.0–5.0)
Cholesterol, Total: 133 mg/dL (ref 100–199)
HDL: 73 mg/dL (ref >39)
LDL Chol Calc (NIH): 49 mg/dL (ref 0–99)
Triglycerides: 45 mg/dL (ref 0–149)
VLDL Cholesterol Cal: 11 mg/dL (ref 5–40)

## 2022-11-14 ENCOUNTER — Other Ambulatory Visit: Payer: Self-pay | Admitting: Cardiology

## 2022-11-14 DIAGNOSIS — E785 Hyperlipidemia, unspecified: Secondary | ICD-10-CM

## 2023-01-20 ENCOUNTER — Encounter (HOSPITAL_COMMUNITY): Payer: Self-pay

## 2023-01-20 ENCOUNTER — Emergency Department (HOSPITAL_COMMUNITY)
Admission: EM | Admit: 2023-01-20 | Discharge: 2023-01-21 | Disposition: A | Discharge status: Home / Self Care | Payer: BC Managed Care – PPO | Attending: Emergency Medicine | Admitting: Emergency Medicine

## 2023-01-20 ENCOUNTER — Emergency Department (HOSPITAL_COMMUNITY): Payer: BC Managed Care – PPO

## 2023-01-20 ENCOUNTER — Other Ambulatory Visit: Payer: Self-pay

## 2023-01-20 ENCOUNTER — Encounter: Payer: Self-pay | Admitting: Cardiology

## 2023-01-20 DIAGNOSIS — I2510 Atherosclerotic heart disease of native coronary artery without angina pectoris: Secondary | ICD-10-CM | POA: Diagnosis not present

## 2023-01-20 DIAGNOSIS — R42 Dizziness and giddiness: Secondary | ICD-10-CM | POA: Insufficient documentation

## 2023-01-20 LAB — BASIC METABOLIC PANEL
Anion gap: 6 (ref 5–15)
BUN: 13 mg/dL (ref 6–20)
CO2: 28 mmol/L (ref 22–32)
Calcium: 9.6 mg/dL (ref 8.9–10.3)
Chloride: 105 mmol/L (ref 98–111)
Creatinine, Ser: 1.08 mg/dL (ref 0.61–1.24)
GFR, Estimated: >60 mL/min (ref >60)
Glucose, Bld: 127 mg/dL — ABNORMAL HIGH (ref 70–99)
Potassium: 3.8 mmol/L (ref 3.5–5.1)
Sodium: 139 mmol/L (ref 135–145)

## 2023-01-20 LAB — CBC
HCT: 45.5 % (ref 39.0–52.0)
Hemoglobin: 14.8 g/dL (ref 13.0–17.0)
MCH: 27.6 pg (ref 26.0–34.0)
MCHC: 32.5 g/dL (ref 30.0–36.0)
MCV: 84.9 fL (ref 80.0–100.0)
Platelets: 285 10*3/uL (ref 150–400)
RBC: 5.36 MIL/uL (ref 4.22–5.81)
RDW: 12.5 % (ref 11.5–15.5)
WBC: 5.8 10*3/uL (ref 4.0–10.5)
nRBC: 0 % (ref 0.0–0.2)

## 2023-01-20 LAB — TROPONIN I (HIGH SENSITIVITY)
Troponin I (High Sensitivity): 5 ng/L (ref <18)
Troponin I (High Sensitivity): 5 ng/L (ref <18)

## 2023-01-20 NOTE — ED Provider Triage Note (Signed)
Emergency Medicine Provider Triage Evaluation Note  Jason Lam , a 47 y.o. male  was evaluated in triage.  Pt complains of abnormal EKG.  Patient woke up this morning with some lightheadedness.  Took his Lam pressure which was little bit high for him.  Went to walk-in clinic over at [facility] [place] for college and EKG that was abnormal.  No EKG to compare to.  Patient does have a history of MI.  He was sent here for further evaluation.  Review of Systems  Positive:  Negative:   Physical Exam  BP (!) 151/96 (BP Location: Right Arm)   Pulse 65   Temp 98.3 F (36.8 C)   Resp 16   Ht 5\' 10"  (1.778 m)   Wt 81.6 kg   SpO2 100%   BMI 25.83 kg/m  Gen:   Awake, no distress   Resp:  Normal effort  MSK:   Moves extremities without difficulty  Other:    Medical Decision Making  Medically screening exam initiated at 5:24 PM.  Appropriate orders placed.  Jason Lam was informed that the remainder of the evaluation will be completed by another provider, this initial triage assessment does not replace that evaluation, and the importance of remaining in the ED until their evaluation is complete.     Honor Loh [place], [place] 01/20/23 1725

## 2023-01-20 NOTE — ED Triage Notes (Signed)
Pt came in via POV d/t feeling lightheaded this morning for a few hrs, but not dizzy. 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 He then went to the [facility] walk-ins & his BP was 115/75 & was told his EKG was abnormal & since they did not have any of his old EKGs to compare with. Pt reports Hx of MI, A/Ox4, denies ay current pain.

## 2023-01-21 NOTE — ED Provider Notes (Signed)
[place] EMERGENCY DEPARTMENT AT [facility] Provider Note   CSN: [id number] Arrival date & time: 01/20/23  1555     History  Chief Complaint  Patient presents with   Abnormal EKG    Jason Lam is a 47 y.o. male.  The history is provided by the patient.  Patient with previous history of CAD presents with lightheadedness.  Patient reports he woke up yesterday morning with lightheadedness.  He had no headache & no focal weakness.  No chest pain or shortness of breath It lasted for about 2 hours and resolved spontaneously. 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  He has never had any return of symptoms.  He has been chest pain-free the entire time.  He is compliant with his medications.  His initial MI occurred soon after running a marathon in 2021.  However he is still able to run for exercise    Home Medications Prior to Admission medications   Medication Sig Start Date End Date Taking? Authorizing Provider  ASPIRIN LOW DOSE 81 MG chewable tablet Chew 81 mg by mouth daily. 12/31/19   [provider]  ezetimibe (ZETIA) 10 MG tablet Take 1 tablet (10 mg total) by mouth daily. 05/21/22 05/16/23  Rollene Rotunda, MD  lisinopril (ZESTRIL) 5 MG tablet TAKE 1 TABLET (5 MG TOTAL) BY MOUTH DAILY. 05/14/22   Rollene Rotunda, MD  rosuvastatin (CRESTOR) 40 MG tablet TAKE 1 TABLET BY MOUTH EVERY DAY 11/15/22   Rollene Rotunda, MD      Allergies    Patient has no known allergies.    Review of Systems   Review of Systems  Constitutional:  Negative for fever.  Respiratory:  Negative for shortness of breath.   Cardiovascular:  Negative for chest pain.  Gastrointestinal:  Negative for vomiting.  Neurological:  Positive for light-headedness. Negative for speech difficulty, weakness and headaches.    Physical Exam Updated Vital Signs BP 129/89 (BP  Location: Left Arm)   Pulse 62   Temp 97.6 F (36.4 C) (Oral)   Resp 17   Ht 1.778 m (5\' 10" )   Wt 81.6 kg   SpO2 100%   BMI 25.83 kg/m  Physical Exam CONSTITUTIONAL: Well developed/well nourished HEAD: Normocephalic/atraumatic EYES: EOMI/PERRL ENMT: Mucous membranes moist NECK: supple no meningeal signs CV: S1/S2 noted, no murmurs/rubs/gallops noted LUNGS: Lungs are clear to auscultation bilaterally, no apparent distress ABDOMEN: soft NEURO: Pt is awake/alert/appropriate, moves all extremitiesx4.  No facial droop.  No arm or leg drift EXTREMITIES: pulses normal/equal SKIN: warm, color normal PSYCH: no abnormalities of mood noted, alert and oriented to situation  ED Results / Procedures / Treatments   Labs (all labs ordered are listed, but only abnormal results are displayed) Labs Reviewed  BASIC METABOLIC PANEL - Abnormal; Notable for the following components:      Result Value   Glucose, Bld 127 (*)    All other components within normal limits  CBC  TROPONIN I (HIGH SENSITIVITY)  TROPONIN I (HIGH SENSITIVITY)    EKG EKG Interpretation Date/Time:  Thursday January 20 2023 16:52:28 EST Ventricular Rate:  60 PR Interval:  150 QRS Duration:  116 QT Interval:  426 QTC Calculation: 426 R Axis:   -60  Text Interpretation: Normal sinus rhythm Left axis deviation Right bundle branch block Possible Inferior infarct , age  undetermined Abnormal ECG No significant change since last tracing Confirmed by Zadie Rhine ([id number]) on 01/21/2023 5:57:17 AM  Radiology DG Chest 2 View  Result Date: 01/20/2023 CLINICAL DATA:  Abnormal EKG. Lightheadedness. Mild increase in blood pressure. EXAM: CHEST - 2 VIEW COMPARISON:  None Available. FINDINGS: Normal heart size and pulmonary vascularity. No focal airspace disease or consolidation in the lungs. No blunting of costophrenic angles. No pneumothorax. Mediastinal contours appear intact. Degenerative changes in the spine. IMPRESSION: No  active cardiopulmonary disease. Electronically Signed   By: Burman Nieves M.D.   On: 01/20/2023 19:37    Procedures Procedures    Medications Ordered in ED Medications - No data to display  ED Course/ Medical Decision Making/ A&P                                 Medical Decision Making  This patient presents to the ED for concern of lightheadedness, this involves an extensive number of treatment options, and is a complaint that carries with it a high risk of complications and morbidity.  The differential diagnosis includes but is not limited to CVA, intracranial hemorrhage, acute coronary syndrome, renal failure, urinary tract infection, electrolyte disturbance, pneumonia    Comorbidities that complicate the patient evaluation: Patient's presentation is complicated by their history of CAD  Social Determinants of Health: Patient is a Designer, multimedia high school teacher   Additional history obtained: Records reviewed  cardiology notes reviewed  Lab Tests: I Ordered, and personally interpreted labs.  The pertinent results include: Labs unremarkable  Imaging Studies ordered: I ordered imaging studies including X-ray chest   I independently visualized and interpreted imaging which showed no acute findings I agree with the radiologist interpretation   Complexity of problems addressed: Patient's presentation is most consistent with  acute presentation with potential threat to life or bodily function  Disposition: After consideration of the diagnostic results and the patient's response to treatment,  I feel that the patent would benefit from discharge   .    Patient had isolated episode of lightheadedness yesterday morning that has since resolved.  No chest pain or shortness of breath.  I was able to review the EKG from the walk-in clinic as well as previous EKGs there are no acute changes.  Labs are overall unremarkable.  Patient has been symptom-free for hours.  He is safe for  discharge home        Final Clinical Impression(s) / ED Diagnoses Final diagnoses:  Lightheadedness    Rx / DC Orders ED Discharge Orders     None         Zadie Rhine, MD 01/21/23 [phone]

## 2023-01-21 NOTE — Discharge Instructions (Signed)

## 2023-01-24 NOTE — Progress Notes (Unsigned)
  Cardiology Office Note:   Date:  01/24/2023  ID:  Jason Lam, DOB 11-14-1975, MRN [id number] PCP: [facility]  [facility] Providers Cardiologist:  Rollene Rotunda, MD {  History of Present Illness:   Jason Lam is a 47 y.o. male ***  He was in the ED on 01/20/2023 with light headedness.  I reviewed these records for this visit.   There were no acute cardiac problems and this was self limited.  EKG demonstrated RBBB.  Previous EKG demonstrated incomplete RBBB.  ****  ROS: ***  Studies Reviewed:    EKG:       ***  Risk Assessment/Calculations:   {Does this patient have ATRIAL FIBRILLATION?:[phone]} No BP recorded.  {Refresh Note OR Click here to enter BP  :1}***        Physical Exam:   VS:  There were no vitals taken for this visit.   Wt Readings from Last 3 Encounters:  01/20/23 180 lb (81.6 kg)  01/22/22 179 lb (81.2 kg)  12/29/20 171 lb 3.2 oz (77.7 kg)     GEN: Well nourished, well developed in no acute distress NECK: No JVD; No carotid bruits CARDIAC: ***RR, *** murmurs, rubs, gallops RESPIRATORY:  Clear to auscultation without rales, wheezing or rhonchi  ABDOMEN: Soft, non-tender, non-distended EXTREMITIES:  No edema; No deformity   ASSESSMENT AND PLAN:   CAD: ***    The patient has no new sypmtoms.  No further cardiovascular testing is indicated.  We will continue with aggressive risk reduction and meds as listed.   HTN:  His BP is *** at target.  No change in therapy.   DYSLIPIDEMIA:   LDL is ***  80 HDL.  I would like his LDL to be in the 50s and we will switch him to Crestor.  He will come back in about 4 months for fasting lipids with LP(a).            Follow up ***  Signed, Rollene Rotunda, MD

## 2023-01-27 ENCOUNTER — Encounter: Payer: Self-pay | Admitting: Cardiology

## 2023-01-27 ENCOUNTER — Ambulatory Visit: Payer: BC Managed Care – PPO | Attending: Cardiology | Admitting: Cardiology

## 2023-01-27 VITALS — BP 122/83 | HR 62 | Ht 70.0 in | Wt 186.0 lb

## 2023-01-27 DIAGNOSIS — E785 Hyperlipidemia, unspecified: Secondary | ICD-10-CM

## 2023-01-27 DIAGNOSIS — I2510 Atherosclerotic heart disease of native coronary artery without angina pectoris: Secondary | ICD-10-CM | POA: Diagnosis not present

## 2023-01-27 DIAGNOSIS — I10 Essential (primary) hypertension: Secondary | ICD-10-CM

## 2023-01-27 NOTE — Patient Instructions (Signed)
Medication Instructions:  No changes.  *If you need a refill on your cardiac medications before your next appointment, please call your pharmacy*   Lab Work: Fasting NMR in January 2025. If you have labs (blood work) drawn today and your tests are completely normal, you will receive your results only by: MyChart Message (if you have MyChart) OR A paper copy in the mail If you have any lab test that is abnormal or we need to change your treatment, we will call you to review the results.  Follow-Up: At [facility], you and your health needs are our priority.  As part of our continuing mission to provide you with exceptional heart care, we have created designated Provider Care Teams.  These Care Teams include your primary Cardiologist (physician) and Advanced Practice Providers (APPs -  Physician Assistants and Nurse Practitioners) who all work together to provide you with the care you need, when you need it.   Your next appointment:   12 month(s)  Provider:   Rollene Rotunda, MD

## 2023-02-04 ENCOUNTER — Ambulatory Visit: Payer: BC Managed Care – PPO | Admitting: Cardiology

## 2023-03-03 LAB — NMR, LIPOPROFILE
Cholesterol, Total: 132 mg/dL (ref 100–199)
HDL Particle Number: 44.9 umol/L (ref >=30.5)
HDL-C: 83 mg/dL (ref >39)
LDL Particle Number: <300 nmol/L (ref <1000)
LDL-C (NIH Calc): 37 mg/dL (ref 0–99)
LP-IR Score: 29 (ref <=45)
Small LDL Particle Number: 121 nmol/L (ref <=527)
Triglycerides: 54 mg/dL (ref 0–149)

## 2023-03-07 ENCOUNTER — Encounter: Payer: Self-pay | Admitting: *Deleted

## 2023-05-13 ENCOUNTER — Other Ambulatory Visit: Payer: Self-pay | Admitting: Cardiology

## 2023-09-29 ENCOUNTER — Encounter: Payer: Self-pay | Admitting: Cardiology

## 2023-11-11 ENCOUNTER — Other Ambulatory Visit: Payer: Self-pay | Admitting: Cardiology

## 2023-11-11 DIAGNOSIS — E785 Hyperlipidemia, unspecified: Secondary | ICD-10-CM

## 2024-01-19 NOTE — Progress Notes (Unsigned)
  Cardiology Office Note:   Date:  01/20/2024  ID:  Jason Lam, DOB 09/14/1975, MRN [id number] PCP: [facility]  [facility] Providers Cardiologist:  Lynwood Schilling, MD {  History of Present Illness:   Jason Lam is a 48 y.o. male with a history of coronary disease.  He had an MI 45 minutes after running a marathon.  This was a lateral MI in September 2021.  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  His EF was 55 to 60%.  He has done well.   He was in the ED on 01/20/2023 with light headedness.   There was no clear etiology.   Since I last saw him he denies any new cardiovascular symptoms.  He is running 25 - 30 miles per week. The patient denies any new symptoms such as chest discomfort, neck or arm discomfort. There has been no new shortness of breath, PND or orthopnea. There have been no reported palpitations, presyncope or syncope.   ROS: As stated in the HPI and negative for all other systems.  Studies Reviewed:    EKG:   EKG Interpretation Date/Time:  Friday January 20 2024 11:40:31 EST Ventricular Rate:  57 PR Interval:  158 QRS Duration:  110 QT Interval:  440 QTC Calculation: 428 R Axis:   14  Text Interpretation: Sinus bradycardia Incomplete right bundle branch block T wave abnormality, consider inferior ischemia When compared with ECG of 20-Jan-2023 16:52, No significant change since last tracing Confirmed by Schilling Lynwood ([id number]) on 01/20/2024 11:58:12 AM    Risk Assessment/Calculations:              Physical Exam:   VS:  BP 120/84 (BP Location: Left Arm, Patient Position: Sitting, Cuff Size: Large)   Pulse (!) 57   Ht 5' 10 (1.778 m)   Wt 184 lb (83.5 kg)   SpO2 98%   BMI 26.40 kg/m    Wt Readings from Last 3 Encounters:  01/20/24 184 lb (83.5 kg)  01/27/23 186 lb (84.4 kg)  01/20/23 180 lb (81.6 kg)     GEN: Well nourished, well developed in no acute distress NECK: No JVD; No carotid bruits CARDIAC: RRR,  no murmurs, rubs, gallops RESPIRATORY:  Clear to auscultation without rales, wheezing or rhonchi  ABDOMEN: Soft, non-tender, non-distended EXTREMITIES:  No edema; No deformity   ASSESSMENT AND PLAN:   CAD: The patient has no new sypmtoms.  No further cardiovascular testing is indicated.  We will continue with aggressive risk reduction and meds as listed.of note he did have luminal irregularities reported on his catheterization in [place]  in 2021.  We talked about risk reduction and paying attention to any future symptoms.   HTN:  His BP is  at target.  No change in therapy.   DYSLIPIDEMIA:   LDL was 37.  HDL 83.  No change in therapy.    ABNORMAL EKG: His EKG looks unchanged from previous.  Back in 2021 his echo did demonstrate some mild LV dilatation.  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  No change in therapy or further testing.  Follow up with me in 2 years  Signed, Lynwood Schilling, MD

## 2024-01-20 ENCOUNTER — Ambulatory Visit: Admitting: Cardiology

## 2024-01-20 ENCOUNTER — Ambulatory Visit: Attending: Cardiology | Admitting: Cardiology

## 2024-01-20 ENCOUNTER — Encounter: Payer: Self-pay | Admitting: Cardiology

## 2024-01-20 VITALS — BP 120/84 | HR 57 | Ht 70.0 in | Wt 184.0 lb

## 2024-01-20 DIAGNOSIS — I2510 Atherosclerotic heart disease of native coronary artery without angina pectoris: Secondary | ICD-10-CM

## 2024-01-20 DIAGNOSIS — I10 Essential (primary) hypertension: Secondary | ICD-10-CM | POA: Diagnosis not present

## 2024-01-20 DIAGNOSIS — E785 Hyperlipidemia, unspecified: Secondary | ICD-10-CM | POA: Diagnosis not present

## 2024-01-20 NOTE — Patient Instructions (Signed)
 Medication Instructions:  Your physician recommends that you continue on your current medications as directed. Please refer to the Current Medication list given to you today.  *If you need a refill on your cardiac medications before your next appointment, please call your pharmacy*  Lab Work: NONE If you have labs (blood work) drawn today and your tests are completely normal, you will receive your results only by: MyChart Message (if you have MyChart) OR A paper copy in the mail If you have any lab test that is abnormal or we need to change your treatment, we will call you to review the results.  Testing/Procedures: NONE  Follow-Up: At [facility] you and your health needs are our priority.  As part of our continuing mission to provide you with exceptional heart care, our providers are all part of one team.  This team includes your primary Cardiologist (physician) and Advanced Practice Providers or APPs (Physician Assistants and Nurse Practitioners) who all work together to provide you with the care you need, when you need it.  Your next appointment:   2 year(s)  Provider:   Eilleen Grates, MD    We recommend signing up for the patient portal called "MyChart".  Sign up information is provided on this After Visit Summary.  MyChart is used to connect with patients for Virtual Visits (Telemedicine).  Patients are able to view lab/test results, encounter notes, upcoming appointments, etc.  Non-urgent messages can be sent to your provider as well.   To learn more about what you can do with MyChart, go to [email or web address].

## 2024-01-29 ENCOUNTER — Emergency Department (HOSPITAL_BASED_OUTPATIENT_CLINIC_OR_DEPARTMENT_OTHER)

## 2024-01-29 ENCOUNTER — Encounter (HOSPITAL_BASED_OUTPATIENT_CLINIC_OR_DEPARTMENT_OTHER): Payer: Self-pay

## 2024-01-29 ENCOUNTER — Emergency Department (HOSPITAL_BASED_OUTPATIENT_CLINIC_OR_DEPARTMENT_OTHER)
Admission: EM | Admit: 2024-01-29 | Discharge: 2024-01-29 | Disposition: A | Discharge status: Home / Self Care | Attending: Emergency Medicine | Admitting: Emergency Medicine

## 2024-01-29 ENCOUNTER — Other Ambulatory Visit: Payer: Self-pay

## 2024-01-29 DIAGNOSIS — R1010 Upper abdominal pain, unspecified: Secondary | ICD-10-CM

## 2024-01-29 DIAGNOSIS — R1013 Epigastric pain: Secondary | ICD-10-CM | POA: Insufficient documentation

## 2024-01-29 DIAGNOSIS — R1011 Right upper quadrant pain: Secondary | ICD-10-CM | POA: Insufficient documentation

## 2024-01-29 DIAGNOSIS — R1012 Left upper quadrant pain: Secondary | ICD-10-CM | POA: Insufficient documentation

## 2024-01-29 DIAGNOSIS — Z7982 Long term (current) use of aspirin: Secondary | ICD-10-CM | POA: Insufficient documentation

## 2024-01-29 LAB — CBC
HCT: 43.7 % (ref 39.0–52.0)
Hemoglobin: 14.7 g/dL (ref 13.0–17.0)
MCH: 28.5 pg (ref 26.0–34.0)
MCHC: 33.6 g/dL (ref 30.0–36.0)
MCV: 84.9 fL (ref 80.0–100.0)
Platelets: 282 K/uL (ref 150–400)
RBC: 5.15 MIL/uL (ref 4.22–5.81)
RDW: 12.4 % (ref 11.5–15.5)
WBC: 6.3 K/uL (ref 4.0–10.5)
nRBC: 0 % (ref 0.0–0.2)

## 2024-01-29 LAB — COMPREHENSIVE METABOLIC PANEL WITH GFR
ALT: 36 U/L (ref 0–44)
AST: 38 U/L (ref 15–41)
Albumin: 4.9 g/dL (ref 3.5–5.0)
Alkaline Phosphatase: 54 U/L (ref 38–126)
Anion gap: 13 (ref 5–15)
BUN: 18 mg/dL (ref 6–20)
CO2: 25 mmol/L (ref 22–32)
Calcium: 10.3 mg/dL (ref 8.9–10.3)
Chloride: 101 mmol/L (ref 98–111)
Creatinine, Ser: 1.01 mg/dL (ref 0.61–1.24)
GFR, Estimated: >60 mL/min (ref >60)
Glucose, Bld: 144 mg/dL — ABNORMAL HIGH (ref 70–99)
Potassium: 4 mmol/L (ref 3.5–5.1)
Sodium: 139 mmol/L (ref 135–145)
Total Bilirubin: 0.6 mg/dL (ref 0.0–1.2)
Total Protein: 7.3 g/dL (ref 6.5–8.1)

## 2024-01-29 LAB — LACTIC ACID, PLASMA: Lactic Acid, Venous: 1.4 mmol/L (ref 0.5–1.9)

## 2024-01-29 LAB — LIPASE, BLOOD: Lipase: 111 U/L — ABNORMAL HIGH (ref 11–51)

## 2024-01-29 MED ORDER — HYOSCYAMINE SULFATE 0.125 MG SL SUBL: 0.1250 mg | SUBLINGUAL_TABLET | SUBLINGUAL | 0 refills | Status: AC | PRN

## 2024-01-29 MED ORDER — PANTOPRAZOLE SODIUM 40 MG PO TBEC: 40.0000 mg | DELAYED_RELEASE_TABLET | Freq: Every day | ORAL | 3 refills | Status: AC

## 2024-01-29 MED ORDER — ONDANSETRON HCL 4 MG/2ML IJ SOLN
4.0000 mg | Freq: Once | INTRAMUSCULAR | Status: AC
  Administered 2024-01-29: 4 mg via INTRAVENOUS
  Filled 2024-01-29: qty 2

## 2024-01-29 MED ORDER — IOHEXOL 350 MG/ML SOLN
100.0000 mL | Freq: Once | INTRAVENOUS | Status: AC | PRN
  Administered 2024-01-29: 100 mL via INTRAVENOUS

## 2024-01-29 MED ORDER — HYDROMORPHONE HCL 1 MG/ML IJ SOLN
1.0000 mg | Freq: Once | INTRAMUSCULAR | Status: AC
  Administered 2024-01-29: 1 mg via INTRAVENOUS
  Filled 2024-01-29: qty 1

## 2024-01-29 MED ORDER — LACTATED RINGERS IV BOLUS
1000.0000 mL | Freq: Once | INTRAVENOUS | Status: AC
  Administered 2024-01-29: 1000 mL via INTRAVENOUS

## 2024-01-29 NOTE — ED Triage Notes (Signed)
 Pt had sudden onset of sharp generalized abdominal pain x 3 hours ago. Denies nausea/vomiting/diarrhea/constipation.

## 2024-01-29 NOTE — ED Provider Notes (Signed)
 [facility] AT [facility] Provider Note   CSN: [id number] Arrival date & time: 01/29/24  9691     Patient presents with: Abdominal Pain   Jason Lam is a 48 y.o. male.   Presents to the emerged apartment for evaluation of abdominal pain.  Patient was awakened from sleep by severe, sharp pain across the upper abdomen.       Prior to Admission medications  Medication Sig Start Date End Date Taking? Authorizing Provider  hyoscyamine  (LEVSIN  SL) 0.125 MG SL tablet Place 1 tablet (0.125 mg total) under the tongue every 4 (four) hours as needed for cramping (abdominal pain). 01/29/24  Yes Andreal Vultaggio, Lonni PARAS, MD  pantoprazole  (PROTONIX ) 40 MG tablet Take 1 tablet (40 mg total) by mouth daily. 01/29/24  Yes Trayquan Kolakowski, Lonni PARAS, MD  ASPIRIN LOW DOSE 81 MG chewable tablet Chew 81 mg by mouth daily. 12/31/19   [provider]  ezetimibe  (ZETIA ) 10 MG tablet TAKE 1 TABLET BY MOUTH EVERY DAY 05/13/23   Lynwood Schilling, MD  lisinopril  (ZESTRIL ) 5 MG tablet TAKE 1 TABLET (5 MG TOTAL) BY MOUTH DAILY. 05/13/23   Lynwood Schilling, MD  rosuvastatin  (CRESTOR ) 40 MG tablet TAKE 1 TABLET BY MOUTH EVERY DAY 11/11/23   Lynwood Schilling, MD    Allergies: Patient has no known allergies.    Review of Systems  Updated Vital Signs BP 98/69 (BP Location: Right Arm)   Pulse 60   Temp 97.8 F (36.6 C) (Temporal)   Resp 18   SpO2 96%   Physical Exam Vitals and nursing note reviewed.  Constitutional:      General: He is not in acute distress.    Appearance: He is well-developed.  HENT:     Head: Normocephalic and atraumatic.     Mouth/Throat:     Mouth: Mucous membranes are moist.  Eyes:     General: Vision grossly intact. Gaze aligned appropriately.     Extraocular Movements: Extraocular movements intact.     Conjunctiva/sclera: Conjunctivae normal.  Cardiovascular:     Rate and Rhythm: Normal rate and regular rhythm.     Pulses: Normal pulses.      Heart sounds: Normal heart sounds, S1 normal and S2 normal. No murmur heard.    No friction rub. No gallop.  Pulmonary:     Effort: Pulmonary effort is normal. No respiratory distress.     Breath sounds: Normal breath sounds.  Abdominal:     Palpations: Abdomen is soft.     Tenderness: There is abdominal tenderness in the right upper quadrant, epigastric area and left upper quadrant. There is no guarding or rebound.     Hernia: No hernia is present.  Musculoskeletal:        General: No swelling.     Cervical back: Full passive range of motion without pain, normal range of motion and neck supple. No pain with movement, spinous process tenderness or muscular tenderness. Normal range of motion.     Right lower leg: No edema.     Left lower leg: No edema.  Skin:    General: Skin is warm and dry.     Capillary Refill: Capillary refill takes less than 2 seconds.     Findings: No ecchymosis, erythema, lesion or wound.  Neurological:     Mental Status: He is alert and oriented to person, place, and time.     GCS: GCS eye subscore is 4. GCS verbal subscore is 5. GCS motor subscore is 6.  Cranial Nerves: Cranial nerves 2-12 are intact.     Sensory: Sensation is intact.     Motor: Motor function is intact. No weakness or abnormal muscle tone.     Coordination: Coordination is intact.  Psychiatric:        Mood and Affect: Mood normal.        Speech: Speech normal.        Behavior: Behavior normal.     (all labs ordered are listed, but only abnormal results are displayed) Labs Reviewed  LIPASE, BLOOD - Abnormal; Notable for the following components:      Result Value   Lipase 111 (*)    All other components within normal limits  COMPREHENSIVE METABOLIC PANEL WITH GFR - Abnormal; Notable for the following components:   Glucose, Bld 144 (*)    All other components within normal limits  CBC  LACTIC ACID, PLASMA  URINALYSIS, ROUTINE W REFLEX MICROSCOPIC     EKG: None  Radiology: CT Angio Abd/Pel W and/or Wo Contrast Result Date: 01/29/2024 CLINICAL DATA:  Abdominal pain.  Evaluate for mesenteric ischemia. EXAM: CTA ABDOMEN AND PELVIS WITHOUT AND WITH CONTRAST TECHNIQUE: Multidetector CT imaging of the abdomen and pelvis was performed using the standard protocol during bolus administration of intravenous contrast. Multiplanar reconstructed images and MIPs were obtained and reviewed to evaluate the vascular anatomy. RADIATION DOSE REDUCTION: This exam was performed according to the departmental dose-optimization program which includes automated exposure control, adjustment of the mA and/or kV according to patient size and/or use of iterative reconstruction technique. CONTRAST:  OMNIPAQUE  IOHEXOL  350 MG/ML SOLN COMPARISON:  CT colonoscopy 06/12/2020. FINDINGS: VASCULAR Aorta: Normal caliber aorta without aneurysm, dissection, vasculitis or significant stenosis. Celiac: Patent without evidence of aneurysm, dissection, vasculitis or significant stenosis. SMA: Patent without evidence of aneurysm, dissection, vasculitis or significant stenosis. Renals: Both renal arteries are patent without evidence of aneurysm, dissection, vasculitis, fibromuscular dysplasia or significant stenosis. IMA: Patent without evidence of aneurysm, dissection, vasculitis or significant stenosis. Inflow: Patent without evidence of aneurysm, dissection, vasculitis or significant stenosis. Proximal Outflow: Bilateral common femoral and visualized portions of the superficial and profunda femoral arteries are patent without evidence of aneurysm, dissection, vasculitis or significant stenosis. Veins: No obvious venous abnormality within the limitations of this arterial phase study. Review of the MIP images confirms the above findings. NON-VASCULAR Lower chest: No acute findings. Hepatobiliary: No suspicious focal abnormality within the liver parenchyma. There is no evidence for  gallstones, gallbladder wall thickening, or pericholecystic fluid. No intrahepatic or extrahepatic biliary dilation. Pancreas: No focal mass lesion. No dilatation of the main duct. No intraparenchymal cyst. No peripancreatic edema. Spleen: No splenomegaly. No suspicious focal mass lesion. Adrenals/Urinary Tract: No adrenal nodule or mass. Kidneys unremarkable. No evidence for hydroureter. The urinary bladder appears normal for the degree of distention. Stomach/Bowel: Possible tiny hiatal hernia. Stomach otherwise unremarkable. Duodenum is normally positioned as is the ligament of Treitz. No small bowel wall thickening. No small bowel dilatation. No small bowel pneumatosis. The terminal ileum is normal. The appendix is normal. Lymphatic: There is no gastrohepatic or hepatoduodenal ligament lymphadenopathy. No retroperitoneal or mesenteric lymphadenopathy. No pelvic sidewall lymphadenopathy. Reproductive: The prostate gland and seminal vesicles are unremarkable. Other: Portal vein and superior mesenteric vein are patent. Splenic vein is patent. No intraperitoneal free fluid. Musculoskeletal: No worrisome lytic or sclerotic osseous abnormality. IMPRESSION: 1. No abnormality of the dominant mesenteric arterial and venous anatomy. The stomach, small bowel, and colon are normal in appearance. No small bowel  wall thickening or pneumatosis. No intraperitoneal free fluid. Electronically Signed   By: Camellia Candle M.D.   On: 01/29/2024 05:51     Procedures   Medications Ordered in the ED  lactated ringers  bolus 1,000 mL (0 mLs Intravenous Stopped 01/29/24 0550)  HYDROmorphone  (DILAUDID ) injection 1 mg (1 mg Intravenous Given 01/29/24 0358)  ondansetron  (ZOFRAN ) injection 4 mg (4 mg Intravenous Given 01/29/24 0358)  iohexol  (OMNIPAQUE ) 350 MG/ML injection 100 mL (100 mLs Intravenous Contrast Given 01/29/24 0525)                                    Medical Decision Making Amount and/or Complexity of Data  Reviewed Labs: ordered. Radiology: ordered.  Risk Prescription drug management.   Differential Diagnosis considered includes, but not limited to: Cholelithiasis; cholecystitis; cholangitis; bowel obstruction; esophagitis; gastritis; peptic ulcer disease; pancreatitis; cardiac.  Patient presents with severe abdominal pain.  Examination revealed epigastric tenderness but it was mild in nature.  It seemed to be out of portion to the exam.  Lab work was unremarkable.  Patient underwent CT abdomen and pelvis, CT angiography to rule out mesenteric ischemia and other entities.  CT was entirely normal.  Patient has had resolution of symptoms with treatment here in the ED.     Final diagnoses:  Pain of upper abdomen    ED Discharge Orders          Ordered    pantoprazole  (PROTONIX ) 40 MG tablet  Daily        01/29/24 0701    hyoscyamine  (LEVSIN  SL) 0.125 MG SL tablet  Every 4 hours PRN        01/29/24 0701               Haze Lonni PARAS, MD 01/29/24 [id number]

## 2024-02-04 ENCOUNTER — Other Ambulatory Visit: Payer: Self-pay | Admitting: Cardiology

## 2024-02-06 ENCOUNTER — Other Ambulatory Visit: Payer: Self-pay | Admitting: Cardiology
